# Patient Record
Sex: Female | Born: 1945 | Race: White | Hispanic: No | Marital: Married | Smoking: Never smoker
Health system: Southern US, Community
[De-identification: ages and names within clinical notes are randomized; demographics above are authoritative.]

## PROBLEM LIST (undated history)

## (undated) DIAGNOSIS — N200 Calculus of kidney: Secondary | ICD-10-CM

## (undated) DIAGNOSIS — IMO0001 Reserved for inherently not codable concepts without codable children: Secondary | ICD-10-CM

## (undated) DIAGNOSIS — K219 Gastro-esophageal reflux disease without esophagitis: Secondary | ICD-10-CM

## (undated) DIAGNOSIS — M8580 Other specified disorders of bone density and structure, unspecified site: Secondary | ICD-10-CM

## (undated) DIAGNOSIS — I10 Essential (primary) hypertension: Secondary | ICD-10-CM

## (undated) DIAGNOSIS — M1990 Unspecified osteoarthritis, unspecified site: Secondary | ICD-10-CM

## (undated) HISTORY — PX: LIPOMA EXCISION: SHX5283

## (undated) HISTORY — PX: BACK SURGERY: SHX140

## (undated) HISTORY — PX: TRIGGER FINGER RELEASE: SHX641

## (undated) HISTORY — PX: ROTATOR CUFF REPAIR: SHX139

## (undated) HISTORY — PX: ABDOMINAL HYSTERECTOMY: SHX81

## (undated) HISTORY — PX: OTHER SURGICAL HISTORY: SHX169

## (undated) HISTORY — PX: KNEE ARTHROSCOPY: SUR90

---

## 1986-02-04 HISTORY — PX: TUBAL LIGATION: SHX77

## 2006-02-04 HISTORY — PX: MICRODISCECTOMY LUMBAR: SUR864

## 2010-01-04 ENCOUNTER — Emergency Department (HOSPITAL_BASED_OUTPATIENT_CLINIC_OR_DEPARTMENT_OTHER)
Admission: EM | Admit: 2010-01-04 | Discharge: 2010-01-05 | Payer: Self-pay | Source: Home / Self Care | Admitting: Emergency Medicine

## 2010-04-17 LAB — URINALYSIS, ROUTINE W REFLEX MICROSCOPIC
Glucose, UA: NEGATIVE mg/dL
Hgb urine dipstick: NEGATIVE
Specific Gravity, Urine: 1.007 (ref 1.005–1.030)
Urobilinogen, UA: 0.2 mg/dL (ref 0.0–1.0)
pH: 6.5 (ref 5.0–8.0)

## 2010-04-17 LAB — COMPREHENSIVE METABOLIC PANEL
Albumin: 4.7 g/dL (ref 3.5–5.2)
BUN: 17 mg/dL (ref 6–23)
Calcium: 10.7 mg/dL — ABNORMAL HIGH (ref 8.4–10.5)
Glucose, Bld: 163 mg/dL — ABNORMAL HIGH (ref 70–99)
Sodium: 143 mEq/L (ref 135–145)
Total Protein: 7.4 g/dL (ref 6.0–8.3)

## 2010-04-17 LAB — GLUCOSE, CAPILLARY: Glucose-Capillary: 166 mg/dL — ABNORMAL HIGH (ref 70–99)

## 2010-04-17 LAB — POCT CARDIAC MARKERS
CKMB, poc: 1.7 ng/mL (ref 1.0–8.0)
Myoglobin, poc: 55.1 ng/mL (ref 12–200)
Troponin i, poc: <0.05 ng/mL (ref 0.00–0.09)

## 2010-04-17 LAB — CBC
HCT: 36 % (ref 36.0–46.0)
MCHC: 34.8 g/dL (ref 30.0–36.0)
MCV: 88.5 fL (ref 78.0–100.0)
Platelets: 329 10*3/uL (ref 150–400)
RDW: 12.7 % (ref 11.5–15.5)
WBC: 8.2 10*3/uL (ref 4.0–10.5)

## 2010-04-17 LAB — DIFFERENTIAL
Lymphocytes Relative: 17 % (ref 12–46)
Lymphs Abs: 1.4 10*3/uL (ref 0.7–4.0)
Monocytes Absolute: 0.6 10*3/uL (ref 0.1–1.0)
Monocytes Relative: 7 % (ref 3–12)
Neutro Abs: 5.6 10*3/uL (ref 1.7–7.7)
Neutrophils Relative %: 69 % (ref 43–77)

## 2010-10-25 ENCOUNTER — Encounter: Payer: Self-pay | Admitting: *Deleted

## 2010-10-25 ENCOUNTER — Emergency Department (INDEPENDENT_AMBULATORY_CARE_PROVIDER_SITE_OTHER): Payer: Medicare Other

## 2010-10-25 ENCOUNTER — Emergency Department (HOSPITAL_BASED_OUTPATIENT_CLINIC_OR_DEPARTMENT_OTHER)
Admission: EM | Admit: 2010-10-25 | Discharge: 2010-10-25 | Disposition: A | Discharge status: Home / Self Care | Payer: Medicare Other | Attending: Emergency Medicine | Admitting: Emergency Medicine

## 2010-10-25 DIAGNOSIS — N1330 Unspecified hydronephrosis: Secondary | ICD-10-CM

## 2010-10-25 DIAGNOSIS — N201 Calculus of ureter: Secondary | ICD-10-CM

## 2010-10-25 DIAGNOSIS — Z8739 Personal history of other diseases of the musculoskeletal system and connective tissue: Secondary | ICD-10-CM | POA: Insufficient documentation

## 2010-10-25 DIAGNOSIS — R1032 Left lower quadrant pain: Secondary | ICD-10-CM | POA: Insufficient documentation

## 2010-10-25 DIAGNOSIS — E119 Type 2 diabetes mellitus without complications: Secondary | ICD-10-CM | POA: Insufficient documentation

## 2010-10-25 DIAGNOSIS — Z79899 Other long term (current) drug therapy: Secondary | ICD-10-CM | POA: Insufficient documentation

## 2010-10-25 DIAGNOSIS — K7689 Other specified diseases of liver: Secondary | ICD-10-CM

## 2010-10-25 HISTORY — DX: Essential (primary) hypertension: I10

## 2010-10-25 HISTORY — DX: Unspecified osteoarthritis, unspecified site: M19.90

## 2010-10-25 HISTORY — DX: Gastro-esophageal reflux disease without esophagitis: K21.9

## 2010-10-25 HISTORY — DX: Reserved for inherently not codable concepts without codable children: IMO0001

## 2010-10-25 LAB — COMPREHENSIVE METABOLIC PANEL
Albumin: 4.6 g/dL (ref 3.5–5.2)
BUN: 18 mg/dL (ref 6–23)
Calcium: 10.8 mg/dL — ABNORMAL HIGH (ref 8.4–10.5)
Chloride: 96 mEq/L (ref 96–112)
Creatinine, Ser: 0.7 mg/dL (ref 0.50–1.10)
GFR calc non Af Amer: >60 mL/min (ref >60)
Total Bilirubin: 0.7 mg/dL (ref 0.3–1.2)

## 2010-10-25 LAB — CBC
HCT: 37.7 % (ref 36.0–46.0)
MCH: 30.4 pg (ref 26.0–34.0)
MCV: 86.1 fL (ref 78.0–100.0)
RDW: 12.7 % (ref 11.5–15.5)
WBC: 11.4 10*3/uL — ABNORMAL HIGH (ref 4.0–10.5)

## 2010-10-25 LAB — URINALYSIS, ROUTINE W REFLEX MICROSCOPIC
Glucose, UA: NEGATIVE mg/dL
Leukocytes, UA: NEGATIVE
Protein, ur: NEGATIVE mg/dL
Urobilinogen, UA: 0.2 mg/dL (ref 0.0–1.0)

## 2010-10-25 LAB — URINE MICROSCOPIC-ADD ON

## 2010-10-25 MED ORDER — MORPHINE SULFATE 4 MG/ML IJ SOLN
4.0000 mg | Freq: Once | INTRAMUSCULAR | Status: AC
  Administered 2010-10-25: 4 mg via INTRAVENOUS
  Filled 2010-10-25: qty 1

## 2010-10-25 MED ORDER — ONDANSETRON HCL 4 MG/2ML IJ SOLN
4.0000 mg | Freq: Once | INTRAMUSCULAR | Status: AC
  Administered 2010-10-25: 4 mg via INTRAVENOUS
  Filled 2010-10-25: qty 2

## 2010-10-25 MED ORDER — HYDROMORPHONE HCL 1 MG/ML IJ SOLN: 1.0000 mg | Freq: Once | INTRAMUSCULAR | Status: DC

## 2010-10-25 MED ORDER — ONDANSETRON HCL 4 MG PO TABS: 4.0000 mg | ORAL_TABLET | Freq: Four times a day (QID) | ORAL | Status: AC

## 2010-10-25 MED ORDER — SODIUM CHLORIDE 0.9 % IV BOLUS (SEPSIS)
1000.0000 mL | Freq: Once | INTRAVENOUS | Status: AC
  Administered 2010-10-25: 1000 mL via INTRAVENOUS

## 2010-10-25 MED ORDER — OXYCODONE-ACETAMINOPHEN 5-325 MG PO TABS: 1.0000 | ORAL_TABLET | ORAL | Status: AC | PRN

## 2010-10-25 NOTE — ED Notes (Signed)
I got urine sample from patient, I labeled sample and placed at bedside if ordered. Sara Roach

## 2010-10-25 NOTE — ED Notes (Signed)
Patient states she was awakened with crampy lower left abdominal pain in mid morning x 2 days ago.  Approximately 3 hours later she had nausea and vomiting.  Pain continued all day on Tuesday and decreased on mid day on Wednesday to tolerable level.  Symptoms have been associated with foul smelling burping gas and diarrhea  X 3 on Wednesday morning.  Patient had a sling procedure for her bladder 2110.  Was seen approximately one month had several uti's and was seen by URO MD and had a normal cystoscopy.  Today has decreased energy, increased nausea and crampy abdominal pain.  Urine specimen on arrival to ED is bloody.

## 2010-10-25 NOTE — ED Provider Notes (Signed)
History     CSN: [id number] Arrival date & time: 10/25/2010 11:25 AM   Chief Complaint  Patient presents with  . Abdominal Pain    LLQ pain x 2 days     (Include location/radiation/quality/duration/timing/severity/associated sxs/prior treatment) Patient is a 64 y.o. female presenting with abdominal pain. The history is provided by the patient.  Abdominal Pain The primary symptoms of the illness include abdominal pain. Episode onset: 2 days ago. The onset of the illness was sudden. The problem has not changed since onset. The abdominal pain is located in the LLQ. The abdominal pain does not radiate. The abdominal pain is relieved by nothing. The abdominal pain is exacerbated by vomiting.  Change in bowel habit: Diarrhea 24 hours ago without blood. Associated symptoms comments: Nausea and vomiting.  Reports diarrhea for 24 hours.  Denies fever or chills.  Does report bilateral low back pain. Associated medical issues comments: No history of kidney stones or diverticulitis.  Patient reports no prior pain similar in nature.     Past Medical History  Diagnosis Date  . Hypertension   . Arthritis   . Diabetes mellitus   . Reflux      Past Surgical History  Procedure Date  . Back surgery   . Tubal ligation 1988  . Abdominal hysterectomy   . Lipoma excision     left shoulder  . Microdiscectomy lumbar 2008    l5 s1  . Knee arthroscopy   . Bladder lift     No family history on file.  History  Substance Use Topics  . Smoking status: Never Smoker   . Smokeless tobacco: Not on file  . Alcohol Use: No    OB History    Grav Para Term Preterm Abortions TAB SAB Ect Mult Living                  Review of Systems  Gastrointestinal: Positive for abdominal pain.  All other systems reviewed and are negative.    Allergies  Hydrocodone; Codeine; Sulfa antibiotics; and Ambien  Home Medications   Current Outpatient Rx  Name Route Sig Dispense Refill  . AMLODIPINE BESYLATE  10 MG PO TABS Oral Take 10 mg by mouth daily.      Marland Kitchen DILTIAZEM HCL COATED BEADS 300 MG PO CP24 Oral Take 300 mg by mouth daily.      Marland Kitchen ESTRADIOL 0.05 MG/24HR TD PTTW Transdermal Place 1 patch onto the skin once a week.      . FENOFIBRATE 145 MG PO TABS Oral Take 145 mg by mouth daily.      . OMEGA-3 FATTY ACIDS 1000 MG PO CAPS Oral Take 2 g by mouth daily.      Marland Kitchen ONE-DAILY MULTI VITAMINS PO TABS Oral Take 1 tablet by mouth daily.      . TELMISARTAN 80 MG PO TABS Oral Take 80 mg by mouth daily.        Physical Exam    BP 172/77  Pulse 96  Temp(Src) 98.5 F (36.9 C) (Oral)  Resp 20  Ht 5\' 3"  (1.6 m)  Wt 163 lb (73.936 kg)  BMI 28.87 kg/m2  SpO2 100%  Physical Exam  Nursing note and vitals reviewed. Constitutional: She is oriented to person, place, and time. She appears well-developed and well-nourished.       Uncomfortable appearing  HENT:  Head: Normocephalic and atraumatic.  Eyes: EOM are normal.  Neck: Normal range of motion.  Cardiovascular: Normal rate and regular rhythm.  Pulmonary/Chest: Effort normal and breath sounds normal.  Abdominal: Soft. She exhibits no distension. There is no tenderness.  Musculoskeletal: Normal range of motion.  Neurological: She is alert and oriented to person, place, and time.  Skin: Skin is warm and dry.  Psychiatric: She has a normal mood and affect. Judgment normal.    ED Course  Procedures  Results for orders placed during the hospital encounter of 10/25/10  CBC      Component Value Range   WBC 11.4 (*) 4.0 - 10.5 (K/uL)   RBC 4.38  3.87 - 5.11 (MIL/uL)   Hemoglobin 13.3  12.0 - 15.0 (g/dL)   HCT 04.5  40.9 - 81.1 (%)   MCV 86.1  78.0 - 100.0 (fL)   MCH 30.4  26.0 - 34.0 (pg)   MCHC 35.3  30.0 - 36.0 (g/dL)   RDW 91.4  78.2 - 95.6 (%)   Platelets 314  150 - 400 (K/uL)  COMPREHENSIVE METABOLIC PANEL      Component Value Range   Sodium 136  135 - 145 (mEq/L)   Potassium 3.2 (*) 3.5 - 5.1 (mEq/L)   Chloride 96  96 - 112  (mEq/L)   CO2 24  19 - 32 (mEq/L)   Glucose, Bld 145 (*) 70 - 99 (mg/dL)   BUN 18  6 - 23 (mg/dL)   Creatinine, Ser 2.13  0.50 - 1.10 (mg/dL)   Calcium 08.6 (*) 8.4 - 10.5 (mg/dL)   Total Protein 7.8  6.0 - 8.3 (g/dL)   Albumin 4.6  3.5 - 5.2 (g/dL)   AST 19  0 - 37 (U/L)   ALT 25  0 - 35 (U/L)   Alkaline Phosphatase 57  39 - 117 (U/L)   Total Bilirubin 0.7  0.3 - 1.2 (mg/dL)   GFR calc non Af Amer >60  >60 (mL/min)   GFR calc Af Amer >60  >60 (mL/min)  URINALYSIS, ROUTINE W REFLEX MICROSCOPIC      Component Value Range   Color, Urine YELLOW  YELLOW    Appearance CLOUDY (*) CLEAR    Specific Gravity, Urine 1.011  1.005 - 1.030    pH 8.0  5.0 - 8.0    Glucose, UA NEGATIVE  NEGATIVE (mg/dL)   Hgb urine dipstick LARGE (*) NEGATIVE    Bilirubin Urine NEGATIVE  NEGATIVE    Ketones, ur NEGATIVE  NEGATIVE (mg/dL)   Protein, ur NEGATIVE  NEGATIVE (mg/dL)   Urobilinogen, UA 0.2  0.0 - 1.0 (mg/dL)   Nitrite NEGATIVE  NEGATIVE    Leukocytes, UA NEGATIVE  NEGATIVE   URINE MICROSCOPIC-ADD ON      Component Value Range   Squamous Epithelial / LPF RARE  RARE    WBC, UA 0-2  <3 (WBC/hpf)   RBC / HPF TOO NUMEROUS TO COUNT  <3 (RBC/hpf)   Bacteria, UA RARE  RARE    Ct Abdomen Pelvis Wo Contrast  10/25/2010  *RADIOLOGY REPORT*  Clinical Data: Evaluate for left ureteral calculus  CT ABDOMEN AND PELVIS WITHOUT CONTRAST  Technique:  Multidetector CT imaging of the abdomen and pelvis was performed following the standard protocol without intravenous contrast.  Comparison: None  Findings: Lung bases appear clear.  There is diffuse fatty infiltration of the liver.  No focal liver abnormalities identified.  The spleen is negative.  The adrenal glands are negative.  The pancreas appears normal.  Postoperative changes involving the stomach identified compatible with partial gastrectomy.  The gallbladder appears normal.  There is no  biliary dilatation.  Normal appearance of the right kidney.  No right  nephrolithiasis or hydronephrosis.  No right hydroureter.  There is a left-sided hydronephrosis and hydroureter as well as perinephric fat stranding.  Within the proximal left ureter there is a stone measuring 3.2 mm, image number 46.  No enlarged upper abdominal lymph nodes.  There is no pelvic or inguinal adenopathy identified.  Urinary bladder is normal.  The small bowel loops have a normal course and caliber.  No evidence for obstruction.  The colon is also normal.  The sigmoid diverticula present without acute inflammation.  Review of the visualized osseous structures is significant for lumbar degenerative disc disease.  IMPRESSION:  1.  Proximal left ureteral calculus measures 3.2 mm.  There is associated left-sided hydronephrosis and hydroureter.  2.  Fatty liver.  Original Report Authenticated By: Rosealee Albee, M.D.   I personally reviewed the labs and radiographs from today's visit   No diagnosis found.   MDM Left-sided proximal ureteral stone.  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  Her pain and nausea have resolved under treatment in ER.  Told to return to ER for worsening nausea and vomiting or abdominal pain.  Told to return for fever greater than 101 or any other new or worsening symptoms       Lyanne Co, MD 10/25/10 1347

## 2011-11-02 ENCOUNTER — Encounter (HOSPITAL_BASED_OUTPATIENT_CLINIC_OR_DEPARTMENT_OTHER): Payer: Self-pay | Admitting: *Deleted

## 2011-11-02 ENCOUNTER — Emergency Department (HOSPITAL_BASED_OUTPATIENT_CLINIC_OR_DEPARTMENT_OTHER)
Admission: EM | Admit: 2011-11-02 | Discharge: 2011-11-02 | Disposition: A | Discharge status: Home / Self Care | Payer: Medicare Other | Attending: Emergency Medicine | Admitting: Emergency Medicine

## 2011-11-02 DIAGNOSIS — I10 Essential (primary) hypertension: Secondary | ICD-10-CM | POA: Insufficient documentation

## 2011-11-02 DIAGNOSIS — Z882 Allergy status to sulfonamides status: Secondary | ICD-10-CM | POA: Insufficient documentation

## 2011-11-02 DIAGNOSIS — S61209A Unspecified open wound of unspecified finger without damage to nail, initial encounter: Secondary | ICD-10-CM | POA: Insufficient documentation

## 2011-11-02 DIAGNOSIS — Z7982 Long term (current) use of aspirin: Secondary | ICD-10-CM | POA: Insufficient documentation

## 2011-11-02 DIAGNOSIS — S61219A Laceration without foreign body of unspecified finger without damage to nail, initial encounter: Secondary | ICD-10-CM

## 2011-11-02 DIAGNOSIS — E119 Type 2 diabetes mellitus without complications: Secondary | ICD-10-CM | POA: Insufficient documentation

## 2011-11-02 DIAGNOSIS — W260XXA Contact with knife, initial encounter: Secondary | ICD-10-CM | POA: Insufficient documentation

## 2011-11-02 DIAGNOSIS — Z888 Allergy status to other drugs, medicaments and biological substances status: Secondary | ICD-10-CM | POA: Insufficient documentation

## 2011-11-02 DIAGNOSIS — W261XXA Contact with sword or dagger, initial encounter: Secondary | ICD-10-CM | POA: Insufficient documentation

## 2011-11-02 DIAGNOSIS — K219 Gastro-esophageal reflux disease without esophagitis: Secondary | ICD-10-CM | POA: Insufficient documentation

## 2011-11-02 DIAGNOSIS — Z885 Allergy status to narcotic agent status: Secondary | ICD-10-CM | POA: Insufficient documentation

## 2011-11-02 MED ORDER — LIDOCAINE HCL 2 % IJ SOLN
20.0000 mL | Freq: Once | INTRAMUSCULAR | Status: AC
  Administered 2011-11-02: 400 mg via INTRADERMAL
  Filled 2011-11-02: qty 20

## 2011-11-02 NOTE — ED Provider Notes (Signed)
History     CSN: [id number]  Arrival date & time 11/02/11  1323   First MD Initiated Contact with Patient 11/02/11 1440      Chief Complaint  Patient presents with  . Laceration    (Consider location/radiation/quality/duration/timing/severity/associated sxs/prior treatment) HPI Comments: Patient presents with laceration to her left thumb that she sustained while cutting with a clean kitchen knife. Patient states that the wound immediately bled and she applied pressure and rinsed with water. Bleeding was quickly controlled. She is not on any blood thinner medications. Patient felt lightheaded for a time but now feels better. She denies numbness, tingling, weakness in her finger. No treatments prior to arrival. Onset acute. Course is constant. Nothing makes symptoms better or worse. Last tetanus < 5 yrs per patient.   Patient is a 66 y.o. female presenting with skin laceration. The history is provided by the patient.  Laceration  The incident occurred 1 to 2 hours ago.    Past Medical History  Diagnosis Date  . Hypertension   . Arthritis   . Diabetes mellitus   . Reflux     Past Surgical History  Procedure Date  . Back surgery   . Tubal ligation 1988  . Abdominal hysterectomy   . Lipoma excision     left shoulder  . Microdiscectomy lumbar 2008    l5 s1  . Knee arthroscopy   . Bladder lift     History reviewed. No pertinent family history.  History  Substance Use Topics  . Smoking status: Never Smoker   . Smokeless tobacco: Not on file  . Alcohol Use: No    OB History    Grav Para Term Preterm Abortions TAB SAB Ect Mult Living                  Review of Systems  Constitutional: Negative for activity change.  Musculoskeletal: Negative for back pain, joint swelling and arthralgias.  Skin: Positive for wound.  Neurological: Negative for weakness and numbness.    Allergies  Hydrocodone; Codeine; Sulfa antibiotics; and Zolpidem tartrate  Home Medications    Current Outpatient Rx  Name Route Sig Dispense Refill  . ASPIRIN 81 MG PO CHEW Oral Chew 81 mg by mouth daily.    . ATORVASTATIN CALCIUM 40 MG PO TABS Oral Take 40 mg by mouth daily.    Marland Kitchen METFORMIN HCL 1000 MG PO TABS Oral Take 1,000 mg by mouth 2 (two) times daily with a meal.    . PIOGLITAZONE HCL 30 MG PO TABS Oral Take 30 mg by mouth daily.    Marland Kitchen AMLODIPINE BESYLATE 10 MG PO TABS Oral Take 10 mg by mouth daily.      Marland Kitchen DILTIAZEM HCL ER COATED BEADS 300 MG PO CP24 Oral Take 300 mg by mouth daily.      Marland Kitchen ESTRADIOL 0.05 MG/24HR TD PTTW Transdermal Place 1 patch onto the skin once a week.      . FENOFIBRATE 145 MG PO TABS Oral Take 145 mg by mouth daily.      . OMEGA-3 FATTY ACIDS 1000 MG PO CAPS Oral Take 2 g by mouth daily.      Marland Kitchen ONE-DAILY MULTI VITAMINS PO TABS Oral Take 1 tablet by mouth daily.      . TELMISARTAN 80 MG PO TABS Oral Take 80 mg by mouth daily.        BP 155/68  Pulse 78  Temp 97.4 F (36.3 C) (Oral)  Resp 20  Ht  5\' 3"  (1.6 m)  Wt 169 lb (76.658 kg)  BMI 29.94 kg/m2  SpO2 99%  Physical Exam  Nursing note and vitals reviewed. Constitutional: She appears well-developed and well-nourished.  HENT:  Head: Normocephalic and atraumatic.  Eyes: Pupils are equal, round, and reactive to light.  Neck: Normal range of motion. Neck supple.  Cardiovascular: Exam reveals no decreased pulses.   Musculoskeletal: She exhibits tenderness. She exhibits no edema.       Patient with superficial 2 cm laceration to medial thumb at the thumbnail. Laceration extends into the thumbnail slightly. Hemostatic. Appears clean. Mild gaping. Patient has full range of motion in thumb. Capillary refill is normal  Neurological: She is alert. No sensory deficit.       Motor, sensation, and vascular distal to the injury is fully intact.   Skin: Skin is warm and dry.  Psychiatric: She has a normal mood and affect.   LACERATION REPAIR Performed by: Carolee Rota Authorized by: Carolee Rota Consent: Verbal consent obtained. Risks and benefits: risks, benefits and alternatives were discussed Consent given by: patient Patient identity confirmed: provided demographic data Prepped and Draped in normal sterile fashion Wound explored  Laceration Location: left thumb  Laceration Length: 2cm  No Foreign Bodies seen or palpated  Anesthesia: local infiltration  Local anesthetic: lidocaine 2% without epinephrine  Anesthetic total: 1 ml  Irrigation method: syringe with splash guard Amount of cleaning: standard  Skin closure: 5-0 Ethilon  Number of sutures: 2  Technique: simple interrupted  Patient tolerance: Patient tolerated the procedure well with no immediate complications.   Patient counseled on wound care, need for stitch removal in 5 days.  The patient was urged to return to the Emergency Department urgently with worsening pain, swelling, expanding erythema especially if it streaks away from the affected area, fever, or if they have any other concerns. Patient verbalized understanding.      ED Course  Procedures (including critical care time)  Labs Reviewed - No data to display No results found.   1. Laceration of finger     3:12 PM Patient seen and examined.    Vital signs reviewed and are as follows: Filed Vitals:   11/02/11 1343  BP: 155/68  Pulse: 78  Temp: 97.4 F (36.3 C)  Resp: 20      MDM  Patient with laceration, repaired. Thumb is neurovascularly intact. Wound is clean. No foreign bodies palpated or visualized. Do not suspect tendon injury.        [place], [place] 11/02/11 [phone]

## 2011-11-02 NOTE — ED Notes (Signed)
Pt states she was cutting onions and cut her left thumb. Bandaged PTA and bleeding controlled.

## 2011-11-02 NOTE — ED Notes (Signed)
Discharge instructions reviewed. Pt verbalized understanding.  

## 2011-11-02 NOTE — ED Provider Notes (Signed)
Medical screening examination/treatment/procedure(s) were conducted as a shared visit with non-physician practitioner(s) and myself.  I personally evaluated the patient during the encounter Patient with a laceration to the thumb requiring 2 sutures. No signs of underlying injury patient was instructed on suture removal in 7-10 days and watching for infection  Gwyneth Sprout, MD 11/02/11 1853

## 2011-11-08 ENCOUNTER — Emergency Department (HOSPITAL_BASED_OUTPATIENT_CLINIC_OR_DEPARTMENT_OTHER)
Admission: EM | Admit: 2011-11-08 | Discharge: 2011-11-08 | Disposition: A | Discharge status: Home / Self Care | Payer: Medicare Other | Attending: Emergency Medicine | Admitting: Emergency Medicine

## 2011-11-08 ENCOUNTER — Encounter (HOSPITAL_BASED_OUTPATIENT_CLINIC_OR_DEPARTMENT_OTHER): Payer: Self-pay | Admitting: *Deleted

## 2011-11-08 DIAGNOSIS — Z4802 Encounter for removal of sutures: Secondary | ICD-10-CM

## 2011-11-08 DIAGNOSIS — K219 Gastro-esophageal reflux disease without esophagitis: Secondary | ICD-10-CM | POA: Insufficient documentation

## 2011-11-08 DIAGNOSIS — I10 Essential (primary) hypertension: Secondary | ICD-10-CM | POA: Insufficient documentation

## 2011-11-08 DIAGNOSIS — Z888 Allergy status to other drugs, medicaments and biological substances status: Secondary | ICD-10-CM | POA: Insufficient documentation

## 2011-11-08 DIAGNOSIS — Z882 Allergy status to sulfonamides status: Secondary | ICD-10-CM | POA: Insufficient documentation

## 2011-11-08 DIAGNOSIS — Z885 Allergy status to narcotic agent status: Secondary | ICD-10-CM | POA: Insufficient documentation

## 2011-11-08 DIAGNOSIS — Z7982 Long term (current) use of aspirin: Secondary | ICD-10-CM | POA: Insufficient documentation

## 2011-11-08 DIAGNOSIS — E119 Type 2 diabetes mellitus without complications: Secondary | ICD-10-CM | POA: Insufficient documentation

## 2011-11-08 NOTE — ED Provider Notes (Addendum)
History     CSN: [id number]  Arrival date & time 11/08/11  0745   First MD Initiated Contact with Patient 11/08/11 [phone]      Chief Complaint  Patient presents with  . Wound Check    (Consider location/radiation/quality/duration/timing/severity/associated sxs/prior treatment) Patient is a 66 y.o. female presenting with wound check. The history is provided by the patient.  Wound Check  She was treated in the ED 5 to 10 days ago. Previous treatment in the ED includes laceration repair. There has been no treatment since the wound repair. There has been no drainage from the wound. There is no redness present. There is no swelling present. The pain has no pain.    Past Medical History  Diagnosis Date  . Hypertension   . Arthritis   . Diabetes mellitus   . Reflux     Past Surgical History  Procedure Date  . Back surgery   . Tubal ligation 1988  . Abdominal hysterectomy   . Lipoma excision     left shoulder  . Microdiscectomy lumbar 2008    l5 s1  . Knee arthroscopy   . Bladder lift     No family history on file.  History  Substance Use Topics  . Smoking status: Never Smoker   . Smokeless tobacco: Not on file  . Alcohol Use: No    OB History    Grav Para Term Preterm Abortions TAB SAB Ect Mult Living                  Review of Systems  All other systems reviewed and are negative.    Allergies  Hydrocodone; Codeine; Sulfa antibiotics; and Zolpidem tartrate  Home Medications   Current Outpatient Rx  Name Route Sig Dispense Refill  . AMLODIPINE BESYLATE 10 MG PO TABS Oral Take 10 mg by mouth daily.      . ASPIRIN 81 MG PO CHEW Oral Chew 81 mg by mouth daily.    . ATORVASTATIN CALCIUM 40 MG PO TABS Oral Take 40 mg by mouth daily.    Marland Kitchen DILTIAZEM HCL ER COATED BEADS 300 MG PO CP24 Oral Take 300 mg by mouth daily.      Marland Kitchen ESTRADIOL 0.05 MG/24HR TD PTTW Transdermal Place 1 patch onto the skin once a week.      . FENOFIBRATE 145 MG PO TABS Oral Take 145 mg by  mouth daily.      . OMEGA-3 FATTY ACIDS 1000 MG PO CAPS Oral Take 2 g by mouth daily.      Marland Kitchen METFORMIN HCL 1000 MG PO TABS Oral Take 1,000 mg by mouth 2 (two) times daily with a meal.    . ONE-DAILY MULTI VITAMINS PO TABS Oral Take 1 tablet by mouth daily.      Marland Kitchen PIOGLITAZONE HCL 30 MG PO TABS Oral Take 30 mg by mouth daily.    . TELMISARTAN 80 MG PO TABS Oral Take 80 mg by mouth daily.        BP 143/64  Pulse 69  Temp 97.5 F (36.4 C) (Oral)  Resp 14  SpO2 99%  Physical Exam  Nursing note and vitals reviewed. Constitutional: She is oriented to person, place, and time. She appears well-developed and well-nourished. No distress.  HENT:  Head: Normocephalic and atraumatic.  Musculoskeletal:       Hands: Neurological: She is alert and oriented to person, place, and time.  Skin: Skin is warm and dry.    ED Course  Procedures (including critical care time)  Labs Reviewed - No data to display No results found.   1. Visit for suture removal       MDM   Here for suture removal.  No signs of complications.  2 sutures removed.        Gwyneth Sprout, MD 11/08/11 1610  Gwyneth Sprout, MD 11/08/11 0800  Gwyneth Sprout, MD 11/08/11 1114

## 2011-11-08 NOTE — ED Notes (Signed)
Here for suture removal from left thumb.  Wound C/D/I.

## 2012-03-15 IMAGING — CT CT ABD-PELV W/O CM
2 of 4 series · 16 of 46 positions shown, 18 images · non-contrast
Comparison: None

CLINICAL DATA: Evaluate for left ureteral calculus

CT ABDOMEN AND PELVIS WITHOUT CONTRAST
TECHNIQUE: Multidetector CT imaging of the abdomen and pelvis was
performed following the standard protocol without intravenous
contrast.

[Series 2: renal stone > 200 lbs 5.0 b31f · axial · 0.80mm/px · z∈[-377,+18]mm · 13 of 87 slices shown, 15 images]
[im 4/87  soft-tissue]
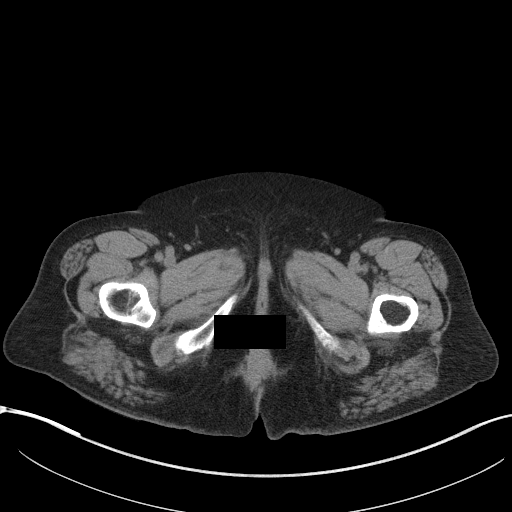
[im 4/87  bone]
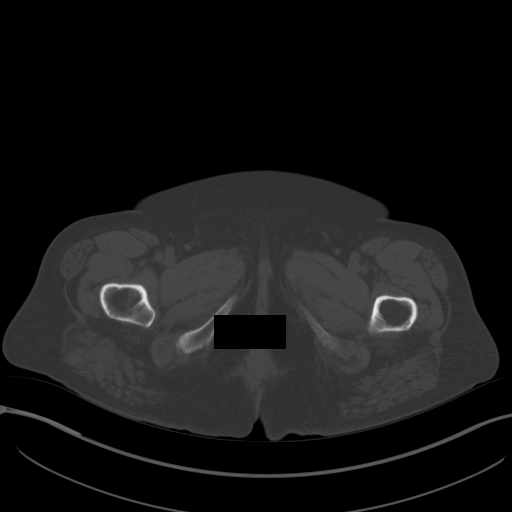
[im 11/87  soft-tissue]
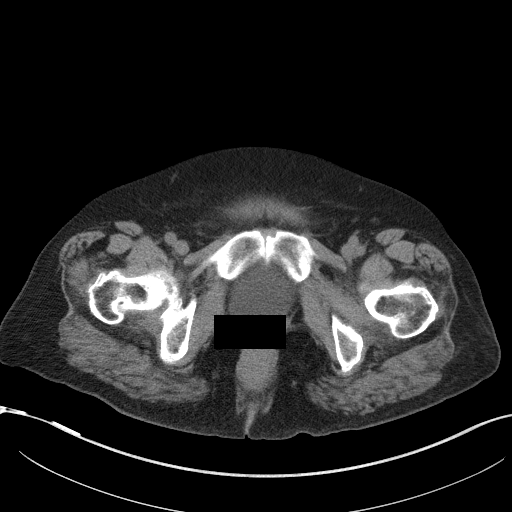
[im 18/87  soft-tissue]
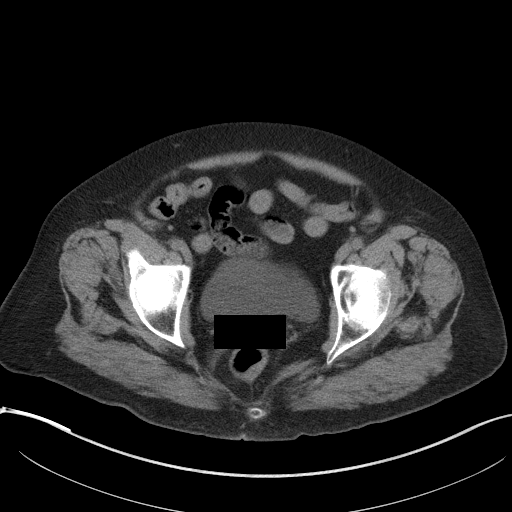
[im 25/87  soft-tissue]
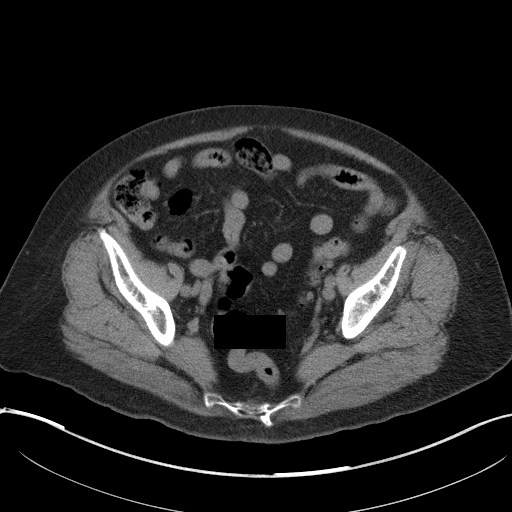
[im 31/87  soft-tissue]
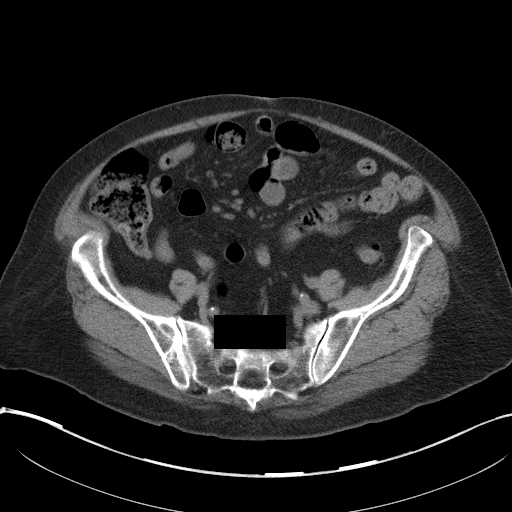
[im 38/87  soft-tissue]
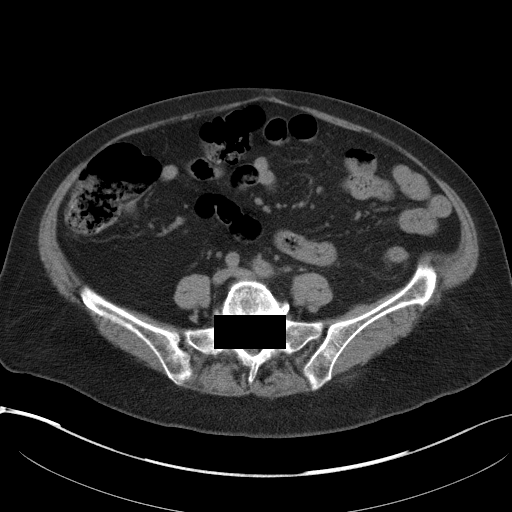
[im 45/87  soft-tissue]
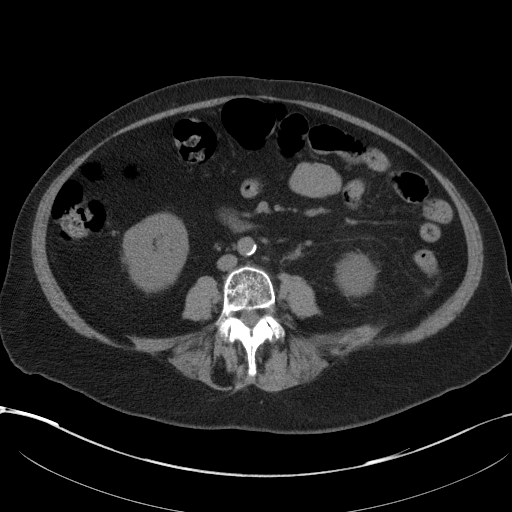
[im 49/87  soft-tissue]
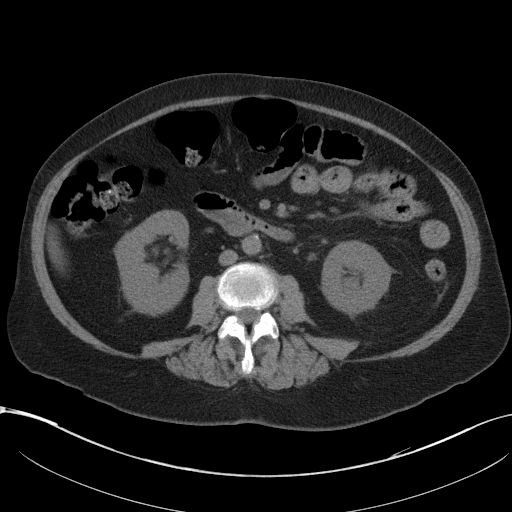
[im 56/87  soft-tissue]
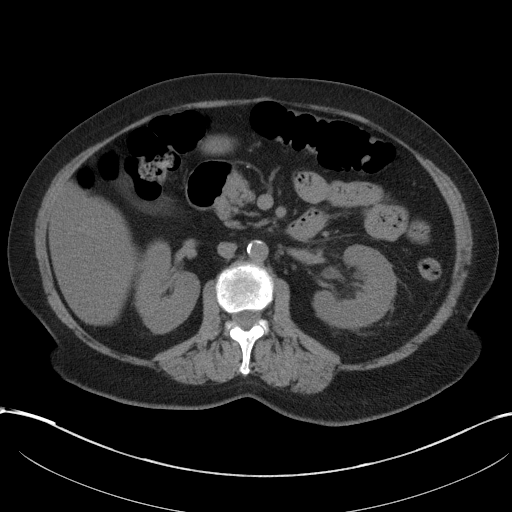
[im 56/87  bone]
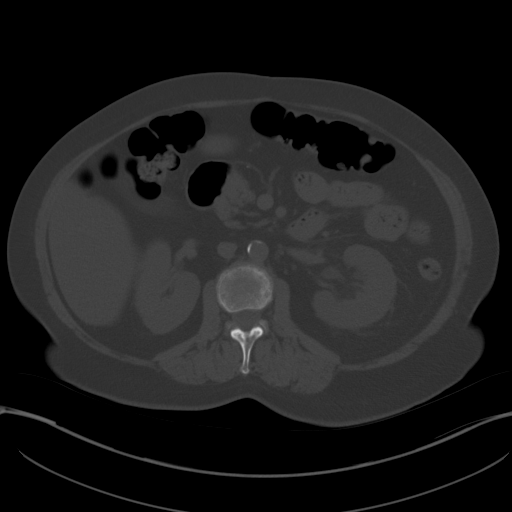
[im 62/87  soft-tissue]
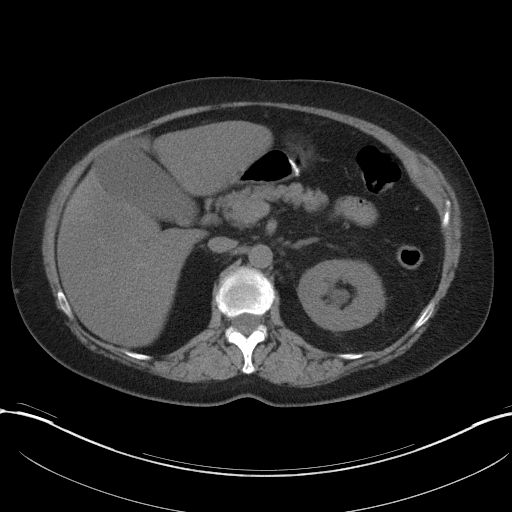
[im 69/87  soft-tissue]
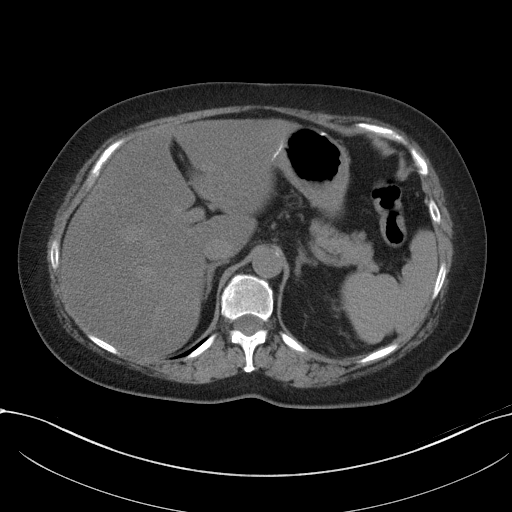
[im 76/87  soft-tissue]
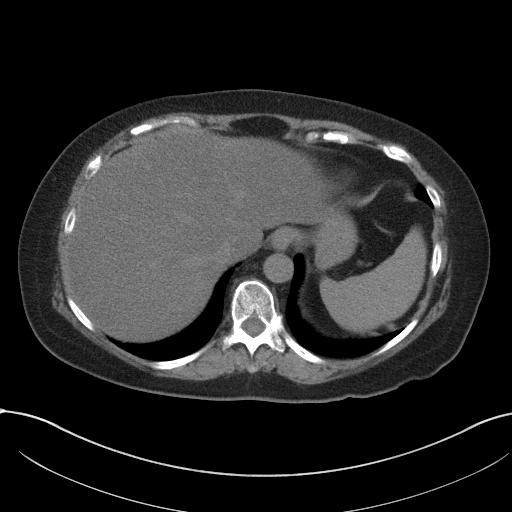
[im 83/87  soft-tissue]
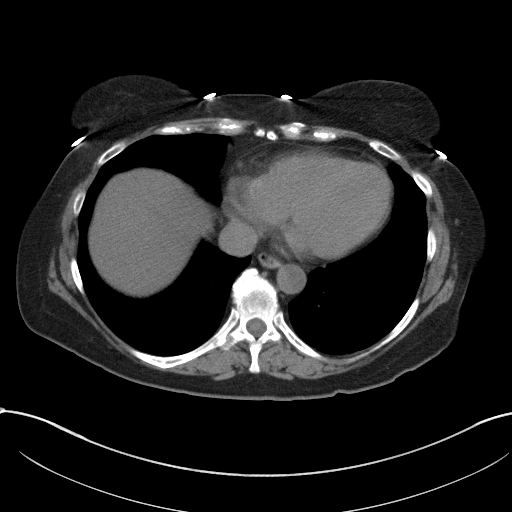

[Series 4: renal stone 3.0 coronal · coronal · 0.77mm/px · 3 of 89 slices shown]
[im 30/89  soft-tissue]
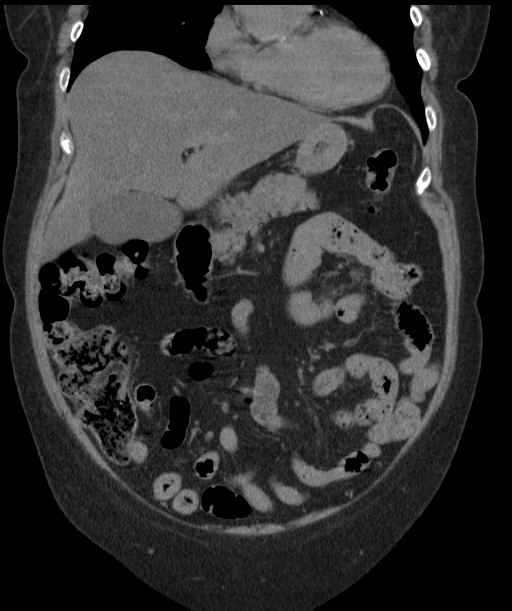
[im 40/89  soft-tissue]
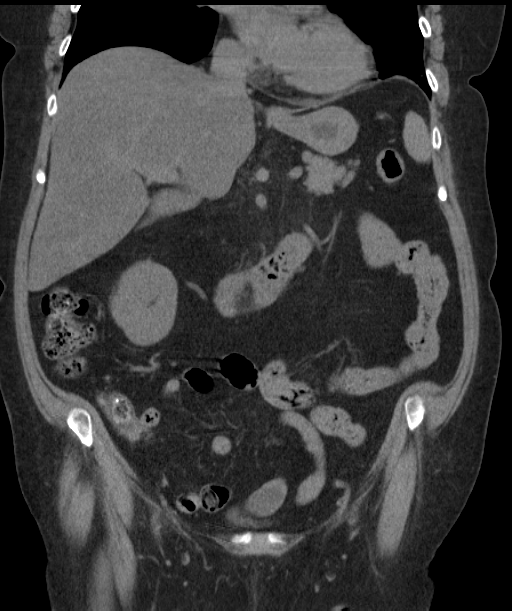
[im 49/89  soft-tissue]
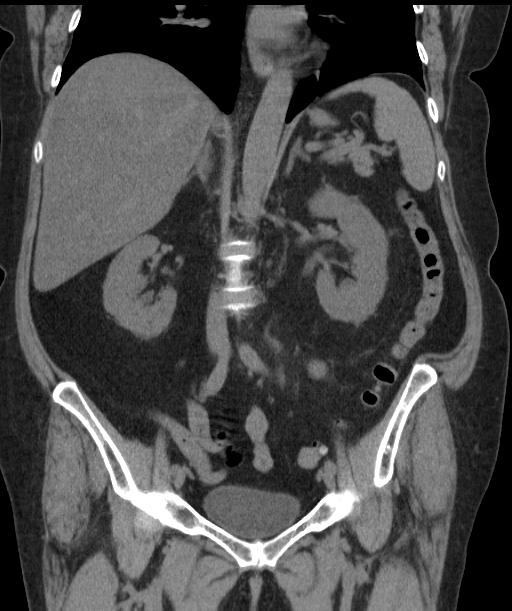

[16 of 46 positions shown; findings below may reference images not displayed]

FINDINGS: Lung bases appear clear.

There is diffuse fatty infiltration of the liver.  No focal liver
abnormalities identified.

The spleen is negative.

The adrenal glands are negative.

The pancreas appears normal.

Postoperative changes involving the stomach identified compatible
with partial gastrectomy.

The gallbladder appears normal.  There is no biliary dilatation.

Normal appearance of the right kidney.  No right nephrolithiasis or
hydronephrosis.  No right hydroureter.

There is a left-sided hydronephrosis and hydroureter as well as
perinephric fat stranding.  Within the proximal left ureter there
is a stone measuring 3.2 mm, image number 46.

No enlarged upper abdominal lymph nodes.

There is no pelvic or inguinal adenopathy identified.

Urinary bladder is normal.

The small bowel loops have a normal course and caliber.  No
evidence for obstruction.

The colon is also normal.

The sigmoid diverticula present without acute inflammation.

Review of the visualized osseous structures is significant for
lumbar degenerative disc disease.
IMPRESSION: 1.  Proximal left ureteral calculus measures 3.2 mm.  There is
associated left-sided hydronephrosis and hydroureter.

2.  Fatty liver.

## 2015-12-31 ENCOUNTER — Emergency Department (HOSPITAL_BASED_OUTPATIENT_CLINIC_OR_DEPARTMENT_OTHER): Payer: Medicare Other

## 2015-12-31 ENCOUNTER — Emergency Department (HOSPITAL_BASED_OUTPATIENT_CLINIC_OR_DEPARTMENT_OTHER)
Admission: EM | Admit: 2015-12-31 | Discharge: 2015-12-31 | Disposition: A | Discharge status: Home / Self Care | Payer: Medicare Other | Attending: Emergency Medicine | Admitting: Emergency Medicine

## 2015-12-31 ENCOUNTER — Encounter (HOSPITAL_BASED_OUTPATIENT_CLINIC_OR_DEPARTMENT_OTHER): Payer: Self-pay | Admitting: *Deleted

## 2015-12-31 DIAGNOSIS — I10 Essential (primary) hypertension: Secondary | ICD-10-CM | POA: Diagnosis not present

## 2015-12-31 DIAGNOSIS — R05 Cough: Secondary | ICD-10-CM | POA: Diagnosis present

## 2015-12-31 DIAGNOSIS — Z79899 Other long term (current) drug therapy: Secondary | ICD-10-CM | POA: Insufficient documentation

## 2015-12-31 DIAGNOSIS — B9789 Other viral agents as the cause of diseases classified elsewhere: Secondary | ICD-10-CM

## 2015-12-31 DIAGNOSIS — Z7982 Long term (current) use of aspirin: Secondary | ICD-10-CM | POA: Insufficient documentation

## 2015-12-31 DIAGNOSIS — E119 Type 2 diabetes mellitus without complications: Secondary | ICD-10-CM | POA: Diagnosis not present

## 2015-12-31 DIAGNOSIS — J069 Acute upper respiratory infection, unspecified: Secondary | ICD-10-CM | POA: Diagnosis not present

## 2015-12-31 HISTORY — DX: Calculus of kidney: N20.0

## 2015-12-31 HISTORY — DX: Other specified disorders of bone density and structure, unspecified site: M85.80

## 2015-12-31 MED ORDER — PREDNISONE 50 MG PO TABS
60.0000 mg | ORAL_TABLET | Freq: Once | ORAL | Status: AC
  Administered 2015-12-31: 60 mg via ORAL
  Filled 2015-12-31: qty 1

## 2015-12-31 NOTE — ED Triage Notes (Signed)
Patient states she has an eight day history of sinus congestion, productive cough with clear, yellow and green secretions, fatigue and sob with exertion.

## 2015-12-31 NOTE — Discharge Instructions (Signed)
Please read and follow all provided instructions.  Your diagnoses today include:  1. Viral URI with cough    You appear to have an upper respiratory infection (URI). An upper respiratory tract infection, or cold, is a viral infection of the air passages leading to the lungs. It should improve gradually after 5-7 days. You may have a lingering cough that lasts for 2- 4 weeks after the infection.  Tests performed today include: Vital signs. See below for your results today.   Medications prescribed:   Take any prescribed medications only as directed. Treatment for your infection is aimed at treating the symptoms. There are no medications, such as antibiotics, that will cure your infection.   Home care instructions:  Follow any educational materials contained in this packet.   Your illness is contagious and can be spread to others, especially during the first 3 or 4 days. It cannot be cured by antibiotics or other medicines. Take basic precautions such as washing your hands often, covering your mouth when you cough or sneeze, and avoiding public places where you could spread your illness to others.   Please continue drinking plenty of fluids.  Use over-the-counter medicines as needed as directed on packaging for symptom relief.  You may also use ibuprofen or tylenol as directed on packaging for pain or fever.  Do not take multiple medicines containing Tylenol or acetaminophen to avoid taking too much of this medication.  Follow-up instructions: Please follow-up with your primary care provider in the next 3 days for further evaluation of your symptoms if you are not feeling better.   Return instructions:  Please return to the Emergency Department if you experience worsening symptoms.  RETURN IMMEDIATELY IF you develop shortness of breath, confusion or altered mental status, a new rash, become dizzy, faint, or poorly responsive, or are unable to be cared for at home. Please return if you have  persistent vomiting and cannot keep down fluids or develop a fever that is not controlled by tylenol or motrin.   Please return if you have any other emergent concerns.  Additional Information:  Your vital signs today were: BP 168/69 (BP Location: Right Arm)    Pulse 87    Temp 98.5 F (36.9 C) (Oral)    Resp 18    Ht 5\' 2"  (1.575 m)    Wt 74.8 kg    SpO2 98%    BMI 30.18 kg/m  If your blood pressure (BP) was elevated above 135/85 this visit, please have this repeated by your doctor within one month. --------------

## 2015-12-31 NOTE — ED Provider Notes (Signed)
[facility] Provider Note   CSN: [id number] Arrival date & time: 12/31/15  1106  History   Chief Complaint Chief Complaint  Patient presents with  . URI    HPI Sara Roach is a 70 y.o. female.  HPI  70 y.o. female with a hx of DM, HTN, presents to the Emergency Department today complaining of eight day history of sinus congestion, cough (productive), fatigue and shortness of breath. Notes minimal relief with mucinex and OTC tylenol/motrin. Pt concern with pneumonia. No hx of pneumonia. No fevers at home. No N/V/D. No CP/ABD pain. PO intake unchanged. ADLs WNL. No pain currently. No other symptoms noted.    Past Medical History:  Diagnosis Date  . Arthritis   . Diabetes mellitus   . Hypertension   . Kidney stone   . Osteopenia   . Reflux     There are no active problems to display for this patient.   Past Surgical History:  Procedure Laterality Date  . ABDOMINAL HYSTERECTOMY    . BACK SURGERY    . bladder lift    . KNEE ARTHROSCOPY    . leiomyosarcoma    . LIPOMA EXCISION     left shoulder  . MICRODISCECTOMY LUMBAR  2008   l5 s1  . ROTATOR CUFF REPAIR Right   . TRIGGER FINGER RELEASE    . TUBAL LIGATION  1988    OB History    No data available       Home Medications    Prior to Admission medications   Medication Sig Start Date End Date Taking? Authorizing Provider  amLODipine (NORVASC) 10 MG tablet Take 10 mg by mouth daily.     Yes Historical Provider, MD  aspirin 81 MG chewable tablet Chew 81 mg by mouth daily.   Yes Historical Provider, MD  atorvastatin (LIPITOR) 40 MG tablet Take 40 mg by mouth daily.   Yes Historical Provider, MD  diltiazem (CARDIZEM CD) 300 MG 24 hr capsule Take 300 mg by mouth daily.     Yes Historical Provider, MD  empagliflozin (JARDIANCE) 25 MG TABS tablet Take 25 mg by mouth daily.   Yes Historical Provider, MD  fenofibrate (TRICOR) 145 MG tablet Take 145 mg by mouth daily.     Yes Historical Provider, MD  fish  oil-omega-3 fatty acids 1000 MG capsule Take 2 g by mouth daily.     Yes Historical Provider, MD  gabapentin (NEURONTIN) 300 MG capsule Take 300 mg by mouth 3 (three) times daily.   Yes Historical Provider, MD  montelukast (SINGULAIR) 10 MG tablet Take 10 mg by mouth at bedtime.   Yes Historical Provider, MD  nystatin cream (MYCOSTATIN) Apply 1 application topically 2 (two) times daily.   Yes Historical Provider, MD  sitaGLIPtin-metformin (JANUMET) 50-1000 MG tablet Take 1 tablet by mouth 2 (two) times daily with a meal.   Yes Historical Provider, MD  telmisartan (MICARDIS) 80 MG tablet Take 80 mg by mouth daily.     Yes Historical Provider, MD  estradiol (VIVELLE-DOT) 0.05 MG/24HR Place 1 patch onto the skin once a week.      Historical Provider, MD  metFORMIN (GLUCOPHAGE) 1000 MG tablet Take 1,000 mg by mouth 2 (two) times daily with a meal.    Historical Provider, MD  Multiple Vitamin (MULTIVITAMIN) tablet Take 1 tablet by mouth daily.      Historical Provider, MD  pioglitazone (ACTOS) 30 MG tablet Take 30 mg by mouth daily.    Historical  Provider, MD    Family History No family history on file.  Social History Social History  Substance Use Topics  . Smoking status: Never Smoker  . Smokeless tobacco: Not on file  . Alcohol use No     Allergies   Hydrocodone; Codeine; Sulfa antibiotics; and Zolpidem tartrate   Review of Systems Review of Systems  Constitutional: Negative for fever.  HENT: Positive for congestion and sore throat. Negative for rhinorrhea.   Respiratory: Positive for shortness of breath.   Cardiovascular: Negative for chest pain.  Gastrointestinal: Negative for diarrhea, nausea and vomiting.   Physical Exam Updated Vital Signs BP 168/69 (BP Location: Right Arm)   Pulse 87   Temp 98.5 F (36.9 C) (Oral)   Resp 18   Ht 5\' 2"  (1.575 m)   Wt 74.8 kg   SpO2 98%   BMI 30.18 kg/m   Physical Exam  Constitutional: She is oriented to person, place, and time.  Vital signs are normal. She appears well-developed and well-nourished.  HENT:  Head: Normocephalic.  Right Ear: Hearing normal.  Left Ear: Hearing normal.  Eyes: Conjunctivae and EOM are normal. Pupils are equal, round, and reactive to light.  Neck: Normal range of motion. Neck supple.  Cardiovascular: Normal rate, regular rhythm, normal heart sounds and intact distal pulses.   Pulmonary/Chest: Effort normal and breath sounds normal.  Abdominal: Soft.  Musculoskeletal: Normal range of motion.  Neurological: She is alert and oriented to person, place, and time.  Skin: Skin is warm and dry.  Psychiatric: She has a normal mood and affect. Her speech is normal and behavior is normal. Thought content normal.  Nursing note and vitals reviewed.  ED Treatments / Results  Labs (all labs ordered are listed, but only abnormal results are displayed) Labs Reviewed - No data to display  EKG  EKG Interpretation None      Radiology Dg Chest 2 View  Result Date: 12/31/2015 CLINICAL DATA:  Cough and congestion for several days EXAM: CHEST  2 VIEW COMPARISON:  01/04/2010 FINDINGS: The heart size and mediastinal contours are within normal limits. Both lungs are clear. The visualized skeletal structures are unremarkable. IMPRESSION: No active cardiopulmonary disease. Electronically Signed   By: Alcide Clever M.D.   On: 12/31/2015 12:24    Procedures Procedures (including critical care time)  Medications Ordered in ED Medications - No data to display   Initial Impression / Assessment and Plan / ED Course  I have reviewed the triage vital signs and the nursing notes.  Pertinent labs & imaging results that were available during my care of the patient were reviewed by me and considered in my medical decision making (see chart for details).  Clinical Course    Final Clinical Impressions(s) / ED Diagnoses   {I have reviewed and evaluated the relevant imaging studies.  {I have reviewed the  relevant previous healthcare records.  {I obtained HPI from historian.   ED Course:  Assessment: Pt is a 70yF presents with URi symptoms x 8 days. No fevers. No N/V/D. No CP/ABD pain. Notes congestion and sore throat. On exam, pt in NAD. VSS. Afebrile. Lungs CTA, Heart RRR. Abdomen nontender/soft. Pt CXR negative for acute infiltrate. Patients symptoms are consistent with URI, likely viral etiology. Discussed that antibiotics are not indicated for viral infections. Pt will be discharged with symptomatic treatment.  Verbalizes understanding and is agreeable with plan. Pt is hemodynamically stable & in NAD prior to dc.  Disposition/Plan:  DC Home Additional Verbal  discharge instructions given and discussed with patient.  Pt Instructed to f/u with PCP in the next week for evaluation and treatment of symptoms. Return precautions given Pt acknowledges and agrees with plan  Supervising Physician Jerelyn ScottMartha Linker, MD  Final diagnoses:  Viral URI with cough    New Prescriptions New Prescriptions   No medications on file     Audry Piliyler Kerianne Gurr, PA-C 12/31/15 1619    Jerelyn ScottMartha Linker, MD 12/31/15 1620

## 2015-12-31 NOTE — ED Notes (Signed)
Patient denies pain and is resting comfortably.  

## 2016-02-21 ENCOUNTER — Emergency Department (HOSPITAL_BASED_OUTPATIENT_CLINIC_OR_DEPARTMENT_OTHER)
Admission: EM | Admit: 2016-02-21 | Discharge: 2016-02-21 | Disposition: A | Discharge status: Home / Self Care | Payer: Medicare Other | Attending: Emergency Medicine | Admitting: Emergency Medicine

## 2016-02-21 ENCOUNTER — Encounter (HOSPITAL_BASED_OUTPATIENT_CLINIC_OR_DEPARTMENT_OTHER): Payer: Self-pay

## 2016-02-21 DIAGNOSIS — Z79899 Other long term (current) drug therapy: Secondary | ICD-10-CM | POA: Insufficient documentation

## 2016-02-21 DIAGNOSIS — Z7984 Long term (current) use of oral hypoglycemic drugs: Secondary | ICD-10-CM | POA: Insufficient documentation

## 2016-02-21 DIAGNOSIS — N3000 Acute cystitis without hematuria: Secondary | ICD-10-CM | POA: Diagnosis not present

## 2016-02-21 DIAGNOSIS — Z7982 Long term (current) use of aspirin: Secondary | ICD-10-CM | POA: Diagnosis not present

## 2016-02-21 DIAGNOSIS — I10 Essential (primary) hypertension: Secondary | ICD-10-CM | POA: Diagnosis not present

## 2016-02-21 DIAGNOSIS — R300 Dysuria: Secondary | ICD-10-CM | POA: Diagnosis present

## 2016-02-21 DIAGNOSIS — E119 Type 2 diabetes mellitus without complications: Secondary | ICD-10-CM | POA: Insufficient documentation

## 2016-02-21 LAB — URINALYSIS, ROUTINE W REFLEX MICROSCOPIC
Bilirubin Urine: NEGATIVE
Glucose, UA: >=500 mg/dL — AB
Ketones, ur: NEGATIVE mg/dL
Nitrite: POSITIVE — AB
PROTEIN: NEGATIVE mg/dL
Specific Gravity, Urine: 1.025 (ref 1.005–1.030)
pH: 7 (ref 5.0–8.0)

## 2016-02-21 LAB — URINALYSIS, MICROSCOPIC (REFLEX)

## 2016-02-21 MED ORDER — CEPHALEXIN 250 MG PO CAPS
500.0000 mg | ORAL_CAPSULE | Freq: Once | ORAL | Status: AC
  Administered 2016-02-21: 500 mg via ORAL
  Filled 2016-02-21: qty 2

## 2016-02-21 MED ORDER — CEPHALEXIN 500 MG PO CAPS: 500.0000 mg | ORAL_CAPSULE | Freq: Three times a day (TID) | ORAL | 0 refills | Status: AC

## 2016-02-21 NOTE — ED Provider Notes (Signed)
[facility]   CSN: [id number] Arrival date & time: 02/21/16  [id number]  By signing my name below, I, Sara Roach, attest that this documentation has been prepared under the direction and in the presence of [name], PA-C. Electronically Signed: Javier Dockerobert Ryan Roach, ER Scribe. 09/16/2015. 7:15 PM.  History   Chief Complaint Chief Complaint  Patient presents with  . Dysuria   The history is provided by the patient. No language interpreter was used.    HPI Comments: Sara Roach is a 71 y.o. female who presents to the Emergency Department complaining of 4 days of urinary urgency, strong urine odor, nausea, dysuria and urinary frequency. She denies flank pain, vomiting, hematuria, abnormal BM, abdominal pain, or fever/chills. Last BM this morning and normal. Eating drinking well. She has taken AZO with no relief.  Hx of UTIs, feels the same. She has not taken any pain medications. States went to PCP yesterday, had UA done, was not given any medications, has not heard from them and the office is closed today. States today symptoms became more unbearable so she came here.    Past Medical History:  Diagnosis Date  . Arthritis   . Diabetes mellitus   . Hypertension   . Kidney stone   . Osteopenia   . Reflux     There are no active problems to display for this patient.   Past Surgical History:  Procedure Laterality Date  . ABDOMINAL HYSTERECTOMY    . BACK SURGERY    . bladder lift    . KNEE ARTHROSCOPY    . leiomyosarcoma    . LIPOMA EXCISION     left shoulder  . MICRODISCECTOMY LUMBAR  2008   l5 s1  . ROTATOR CUFF REPAIR Right   . TRIGGER FINGER RELEASE    . TUBAL LIGATION  1988    OB History    No data available       Home Medications    Prior to Admission medications   Medication Sig Start Date End Date Taking? Authorizing Provider  amLODipine (NORVASC) 10 MG tablet Take 10 mg by mouth daily.      Historical Provider, MD    aspirin 81 MG chewable tablet Chew 81 mg by mouth daily.    Historical Provider, MD  atorvastatin (LIPITOR) 40 MG tablet Take 40 mg by mouth daily.    Historical Provider, MD  diltiazem (CARDIZEM CD) 300 MG 24 hr capsule Take 300 mg by mouth daily.      Historical Provider, MD  empagliflozin (JARDIANCE) 25 MG TABS tablet Take 25 mg by mouth daily.    Historical Provider, MD  estradiol (VIVELLE-DOT) 0.05 MG/24HR Place 1 patch onto the skin once a week.      Historical Provider, MD  fenofibrate (TRICOR) 145 MG tablet Take 145 mg by mouth daily.      Historical Provider, MD  fish oil-omega-3 fatty acids 1000 MG capsule Take 2 g by mouth daily.      Historical Provider, MD  gabapentin (NEURONTIN) 300 MG capsule Take 300 mg by mouth 3 (three) times daily.    Historical Provider, MD  metFORMIN (GLUCOPHAGE) 1000 MG tablet Take 1,000 mg by mouth 2 (two) times daily with a meal.    Historical Provider, MD  montelukast (SINGULAIR) 10 MG tablet Take 10 mg by mouth at bedtime.    Historical Provider, MD  Multiple Vitamin (MULTIVITAMIN) tablet Take 1 tablet by mouth daily.  Historical Provider, MD  nystatin cream (MYCOSTATIN) Apply 1 application topically 2 (two) times daily.    Historical Provider, MD  pioglitazone (ACTOS) 30 MG tablet Take 30 mg by mouth daily.    Historical Provider, MD  sitaGLIPtin-metformin (JANUMET) 50-1000 MG tablet Take 1 tablet by mouth 2 (two) times daily with a meal.    Historical Provider, MD  telmisartan (MICARDIS) 80 MG tablet Take 80 mg by mouth daily.      Historical Provider, MD    Family History No family history on file.  Social History Social History  Substance Use Topics  . Smoking status: Never Smoker  . Smokeless tobacco: Never Used  . Alcohol use No     Allergies   Hydrocodone; Codeine; Sulfa antibiotics; and Zolpidem tartrate   Review of Systems Review of Systems  Constitutional: Negative for chills and fever.  Gastrointestinal: Positive for  nausea. Negative for abdominal pain, diarrhea and vomiting.  Genitourinary: Positive for dysuria, frequency and urgency. Negative for flank pain, hematuria and pelvic pain.  Musculoskeletal: Negative for back pain.  All other systems reviewed and are negative.  A complete 10 system review of systems was obtained and all systems are negative except as noted in the HPI and PMH.   Physical Exam Updated Vital Signs BP 153/67 (BP Location: Left Arm)   Pulse 87   Temp 98.1 F (36.7 C) (Oral)   Resp 18   Ht 5\' 2"  (1.575 m)   Wt 165 lb (74.8 kg)   SpO2 100%   BMI 30.18 kg/m   Physical Exam  Constitutional: She is oriented to person, place, and time. She appears well-developed and well-nourished. No distress.  HENT:  Head: Normocephalic and atraumatic.  Eyes: Pupils are equal, round, and reactive to light.  Neck: Neck supple.  Cardiovascular: Normal rate, regular rhythm and normal heart sounds.   Pulmonary/Chest: Effort normal and breath sounds normal. No respiratory distress. She has no wheezes. She has no rales.  Abdominal: Soft. Bowel sounds are normal. She exhibits no distension. There is no guarding.  Mild suprapubic tenderness. No CVA tenderness bilaterally  Musculoskeletal: Normal range of motion.  Neurological: She is alert and oriented to person, place, and time. Coordination normal.  Skin: Skin is warm and dry. She is not diaphoretic.  Psychiatric: She has a normal mood and affect. Her behavior is normal.  Nursing note and vitals reviewed.   ED Treatments / Results  DIAGNOSTIC STUDIES: Oxygen Saturation is 100% on RA, normal by my interpretation.    COORDINATION OF CARE: 7:15 PM Discussed treatment plan with pt at bedside and pt agreed to plan.  Labs (all labs ordered are listed, but only abnormal results are displayed) Labs Reviewed  URINALYSIS, ROUTINE W REFLEX MICROSCOPIC - Abnormal; Notable for the following:       Result Value   Color, Urine ORANGE (*)     APPearance CLOUDY (*)    Glucose, UA >=500 (*)    Hgb urine dipstick SMALL (*)    Nitrite POSITIVE (*)    Leukocytes, UA MODERATE (*)    All other components within normal limits  URINALYSIS, MICROSCOPIC (REFLEX) - Abnormal; Notable for the following:    Bacteria, UA FEW (*)    Squamous Epithelial / LPF 0-5 (*)    All other components within normal limits  URINE CULTURE    EKG  EKG Interpretation None       Radiology No results found.  Procedures Procedures (including critical care time)  Medications  Ordered in ED Medications - No data to display   Initial Impression / Assessment and Plan / ED Course  I have reviewed the triage vital signs and the nursing notes.  Pertinent labs & imaging results that were available during my care of the patient were reviewed by me and considered in my medical decision making (see chart for details).  Clinical Course   Patient with urinary frequency, urgency, orders urine, onset 4 days ago. History of UTIs, feels the same. No vomiting, no flank pain, no fever. Afebrile here, vital signs are normal here other than mild hypertension, blood pressures 150/67. Abdomen is soft, nontender. No CVA tenderness on exam. No concern for pyelonephritis or systemic illness/sepsis. Urinalysis suggestive of infection with too numerous to count white blood cells, few bacteria. Will start on Keflex. Will have patient follow with primary care doctor for recheck, return precautions discussed.  Vitals:   02/21/16 1901  BP: 153/67  Pulse: 87  Resp: 18  Temp: 98.1 F (36.7 C)  TempSrc: Oral  SpO2: 100%  Weight: 74.8 kg  Height: 5\' 2"  (1.575 m)    Final Clinical Impressions(s) / ED Diagnoses   Final diagnoses:  Acute cystitis without hematuria    New Prescriptions Discharge Medication List as of 02/21/2016  7:43 PM    START taking these medications   Details  cephALEXin (KEFLEX) 500 MG capsule Take 1 capsule (500 mg total) by mouth 3 (three) times  daily., Starting Wed 02/21/2016, Print         I personally performed the services described in this documentation, which was scribed in my presence. The recorded information has been reviewed and is accurate.        Jaynie Crumble, PA-C 02/21/16 1951    Maia Plan, MD 02/22/16 [id number]

## 2016-02-21 NOTE — Discharge Instructions (Signed)
Continue azo as needed. Take keflex as prescribed until all gone. Follow up with your doctor or return here if not improving in 2-3 days. Return sooner if rapidly worsening, if develop high fever, back pain, vomiting, any new concerning symptom.

## 2016-02-21 NOTE — ED Triage Notes (Signed)
Pt c/o dysuria x 5 days-NAD-steady gait

## 2016-02-24 LAB — URINE CULTURE

## 2017-05-21 IMAGING — CR DG CHEST 2V
2 series · 2 of 2 positions shown · non-contrast
Comparison: 01/04/2010

CLINICAL DATA: Cough and congestion for several days

EXAM:
CHEST  2 VIEW

[w chest pa]
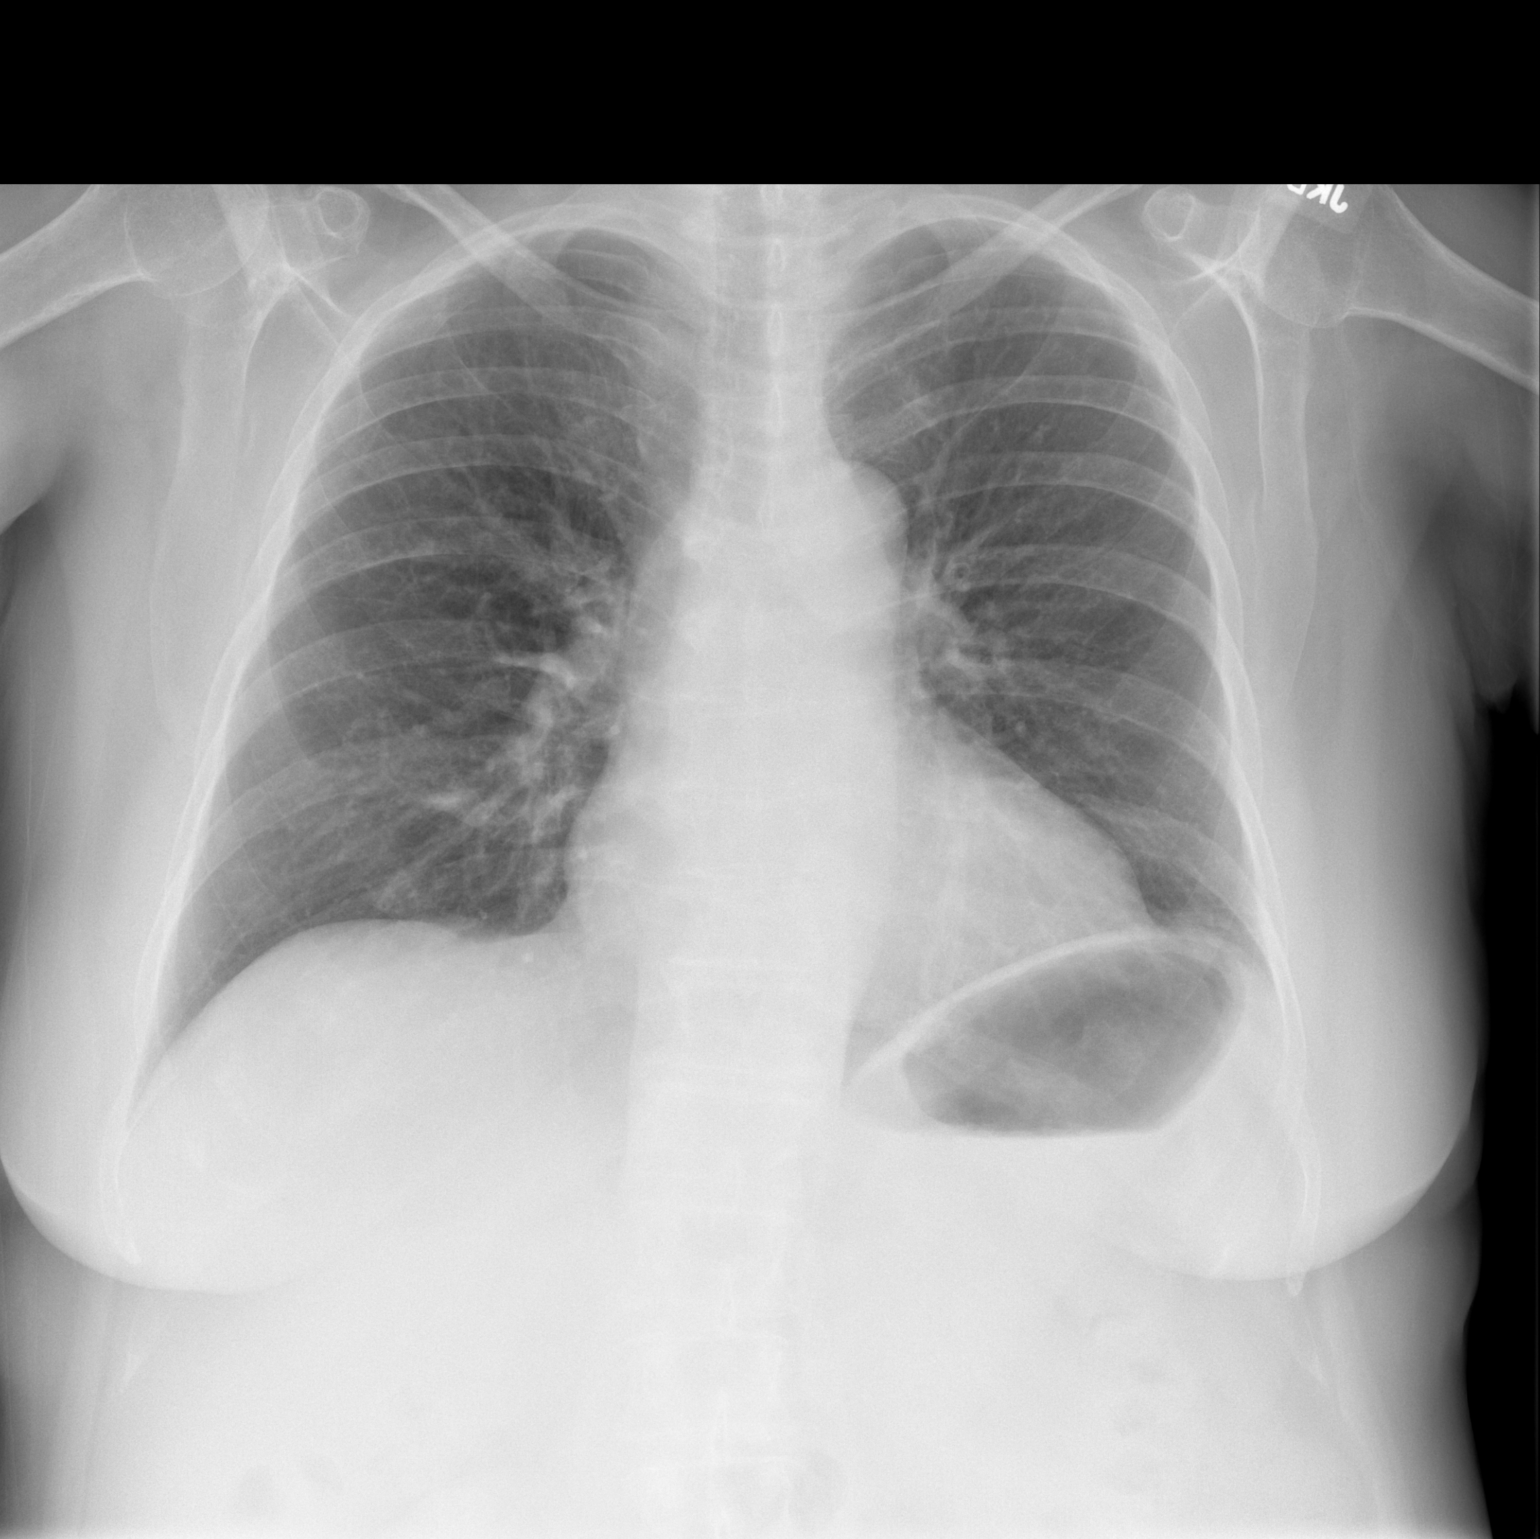

[w chest lat]
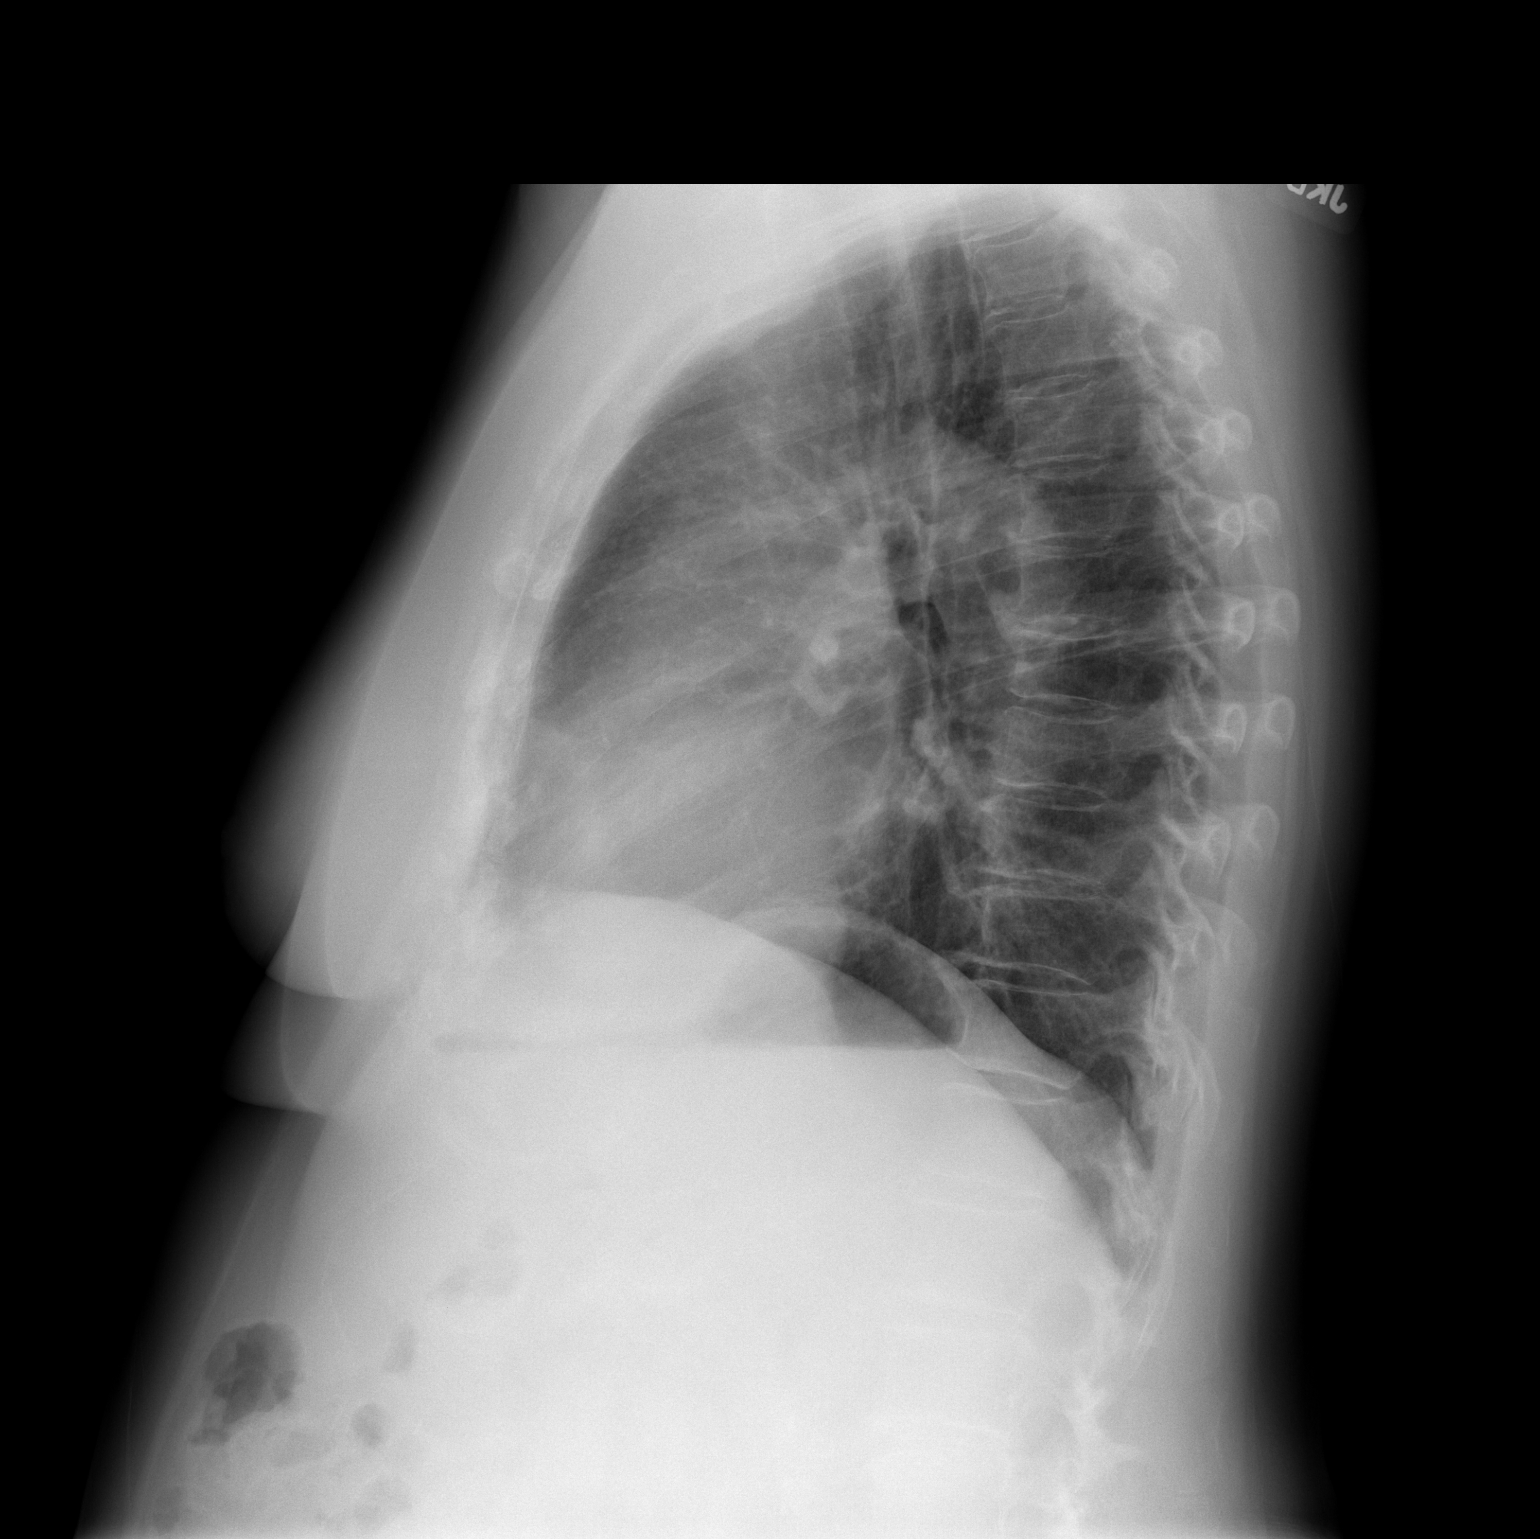

[2 of 2 positions shown; findings below may reference images not displayed]

FINDINGS: The heart size and mediastinal contours are within normal limits.
Both lungs are clear. The visualized skeletal structures are
unremarkable.
IMPRESSION: No active cardiopulmonary disease.

## 2018-02-03 ENCOUNTER — Emergency Department (HOSPITAL_BASED_OUTPATIENT_CLINIC_OR_DEPARTMENT_OTHER)
Admission: EM | Admit: 2018-02-03 | Discharge: 2018-02-03 | Disposition: A | Discharge status: Home / Self Care | Payer: Medicare Other | Attending: Emergency Medicine | Admitting: Emergency Medicine

## 2018-02-03 ENCOUNTER — Other Ambulatory Visit: Payer: Self-pay

## 2018-02-03 ENCOUNTER — Encounter (HOSPITAL_BASED_OUTPATIENT_CLINIC_OR_DEPARTMENT_OTHER): Payer: Self-pay | Admitting: Emergency Medicine

## 2018-02-03 ENCOUNTER — Emergency Department (HOSPITAL_BASED_OUTPATIENT_CLINIC_OR_DEPARTMENT_OTHER): Payer: Medicare Other

## 2018-02-03 DIAGNOSIS — B9689 Other specified bacterial agents as the cause of diseases classified elsewhere: Secondary | ICD-10-CM | POA: Diagnosis not present

## 2018-02-03 DIAGNOSIS — Z79899 Other long term (current) drug therapy: Secondary | ICD-10-CM | POA: Insufficient documentation

## 2018-02-03 DIAGNOSIS — I10 Essential (primary) hypertension: Secondary | ICD-10-CM | POA: Insufficient documentation

## 2018-02-03 DIAGNOSIS — J019 Acute sinusitis, unspecified: Secondary | ICD-10-CM | POA: Diagnosis not present

## 2018-02-03 DIAGNOSIS — Z7982 Long term (current) use of aspirin: Secondary | ICD-10-CM | POA: Insufficient documentation

## 2018-02-03 DIAGNOSIS — R05 Cough: Secondary | ICD-10-CM | POA: Diagnosis present

## 2018-02-03 DIAGNOSIS — H6691 Otitis media, unspecified, right ear: Secondary | ICD-10-CM

## 2018-02-03 DIAGNOSIS — E119 Type 2 diabetes mellitus without complications: Secondary | ICD-10-CM | POA: Insufficient documentation

## 2018-02-03 MED ORDER — AMOXICILLIN-POT CLAVULANATE 875-125 MG PO TABS: 1.0000 | ORAL_TABLET | Freq: Two times a day (BID) | ORAL | 0 refills | Status: AC

## 2018-02-03 NOTE — Discharge Instructions (Addendum)
Saline sinus rinse twice daily. Flonase twice daily for 5 days, then continue with daily use. Zyrtec daily. Take Augmentin as prescribed and complete the full course. You may continue with Mucinex. Avoid dairy products (makes mucous thick). Drink plenty of water to keep mucous thin. Use a humidifier at night.

## 2018-02-03 NOTE — ED Provider Notes (Signed)
[facility] EMERGENCY DEPARTMENT Provider Note   CSN: [id number] Arrival date & time: 02/03/18  1020     History   Chief Complaint Chief Complaint  Patient presents with  . Cough    HPI Sara Roach is a 72 y.o. female.  72 year old female presents with complaint of sinus congestion x1 month with productive cough.  Patient has been taking Mucinex, reports worsening pain in her sinuses, went to her PCP who prescribed Zithromax 5 days ago.  Patient completed a 5-day course of Zithromax, reports symptoms have only gotten worse and now has pain and pressure in her right ear.  Patient denies fevers, chills, shortness of breath.  Patient is taking Flonase daily as well as Singulair in addition to her regular medicines.  No other complaints or concerns.     Past Medical History:  Diagnosis Date  . Arthritis   . Diabetes mellitus   . Hypertension   . Kidney stone   . Osteopenia   . Reflux     There are no active problems to display for this patient.   Past Surgical History:  Procedure Laterality Date  . ABDOMINAL HYSTERECTOMY    . BACK SURGERY    . bladder lift    . KNEE ARTHROSCOPY    . leiomyosarcoma    . LIPOMA EXCISION     left shoulder  . MICRODISCECTOMY LUMBAR  2008   l5 s1  . ROTATOR CUFF REPAIR Right   . TRIGGER FINGER RELEASE    . TUBAL LIGATION  1988     OB History   No obstetric history on file.      Home Medications    Prior to Admission medications   Medication Sig Start Date End Date Taking? Authorizing Provider  amLODipine (NORVASC) 10 MG tablet Take 10 mg by mouth daily.      [provider]  amoxicillin-clavulanate (AUGMENTIN) 875-125 MG tablet Take 1 tablet by mouth every 12 (twelve) hours for 10 days. 02/03/18 02/13/18  Jeannie FendMurphy, Raye Wiens A, PA-C  aspirin 81 MG chewable tablet Chew 81 mg by mouth daily.    [provider]  atorvastatin (LIPITOR) 40 MG tablet Take 40 mg by mouth daily.    [provider]    cephALEXin (KEFLEX) 500 MG capsule Take 1 capsule (500 mg total) by mouth 3 (three) times daily. 02/21/16   Kirichenko, Tatyana, PA-C  diltiazem (CARDIZEM CD) 300 MG 24 hr capsule Take 300 mg by mouth daily.      [provider]  empagliflozin (JARDIANCE) 25 MG TABS tablet Take 25 mg by mouth daily.    [provider]  estradiol (VIVELLE-DOT) 0.05 MG/24HR Place 1 patch onto the skin once a week.      [provider]  fenofibrate (TRICOR) 145 MG tablet Take 145 mg by mouth daily.      [provider]  fish oil-omega-3 fatty acids 1000 MG capsule Take 2 g by mouth daily.      [provider]  gabapentin (NEURONTIN) 300 MG capsule Take 300 mg by mouth 3 (three) times daily.    [provider]  metFORMIN (GLUCOPHAGE) 1000 MG tablet Take 1,000 mg by mouth 2 (two) times daily with a meal.    [provider]  montelukast (SINGULAIR) 10 MG tablet Take 10 mg by mouth at bedtime.    [provider]  Multiple Vitamin (MULTIVITAMIN) tablet Take 1 tablet by mouth daily.      [provider]  nystatin cream (MYCOSTATIN) Apply 1 application topically 2 (two) times daily.    [provider]  pioglitazone (ACTOS) 30 MG tablet Take 30 mg by mouth daily.    [provider]  sitaGLIPtin-metformin (JANUMET) 50-1000 MG tablet Take 1 tablet by mouth 2 (two) times daily with a meal.    [provider]  telmisartan (MICARDIS) 80 MG tablet Take 80 mg by mouth daily.      [provider]    Family History History reviewed. No pertinent family history.  Social History Social History   Tobacco Use  . Smoking status: Never Smoker  . Smokeless tobacco: Never Used  Substance Use Topics  . Alcohol use: No  . Drug use: No     Allergies   Hydrocodone; Sulfa antibiotics; Sulfasalazine; Cephalexin; Codeine; Nitrofurantoin; Pioglitazone; Hydrocodone-acetaminophen; Zolpidem; Ambien  [zolpidem tartrate]; and  Hydrocodone bitartrate   Review of Systems Review of Systems  Constitutional: Negative for chills and fever.  HENT: Positive for congestion, ear pain, sinus pressure and sinus pain. Negative for sore throat.   Respiratory: Positive for cough. Negative for shortness of breath and wheezing.   Gastrointestinal: Negative for vomiting.  Skin: Negative for wound.  Allergic/Immunologic: Positive for immunocompromised state.  Neurological: Negative for headaches.  Hematological: Negative for adenopathy.  Psychiatric/Behavioral: Negative for confusion.  All other systems reviewed and are negative.    Physical Exam Updated Vital Signs BP (!) 151/90   Pulse 90   Temp 98.4 F (36.9 C) (Oral)   Resp 18   Ht 5\' 2"  (1.575 m)   Wt 78 kg   SpO2 100%   BMI 31.46 kg/m   Physical Exam Vitals signs and nursing note reviewed.  Constitutional:      Appearance: Normal appearance.  HENT:     Head: Normocephalic and atraumatic.     Right Ear: Ear canal normal. A middle ear effusion is present. Tympanic membrane is erythematous and bulging.     Left Ear: Tympanic membrane and ear canal normal.  No middle ear effusion.     Nose: Congestion present.     Right Sinus: Frontal sinus tenderness present. No maxillary sinus tenderness.     Left Sinus: No maxillary sinus tenderness or frontal sinus tenderness.     Mouth/Throat:     Pharynx: No oropharyngeal exudate or posterior oropharyngeal erythema.  Eyes:     General:        Right eye: No discharge.        Left eye: No discharge.  Neck:     Musculoskeletal: Neck supple.  Cardiovascular:     Rate and Rhythm: Normal rate and regular rhythm.     Pulses: Normal pulses.  Pulmonary:     Effort: Pulmonary effort is normal.     Breath sounds: Normal breath sounds.  Lymphadenopathy:     Cervical: No cervical adenopathy.  Skin:    General: Skin is warm and dry.     Findings: No erythema or rash.  Neurological:     Mental Status: She is alert and  oriented to person, place, and time.  Psychiatric:        Behavior: Behavior normal.      ED Treatments / Results  Labs (all labs ordered are listed, but only abnormal results are displayed) Labs Reviewed - No data to display  EKG None  Radiology Dg Chest 2 View  Result Date: 02/03/2018 CLINICAL DATA:  Cough for 5 weeks. EXAM: CHEST - 2 VIEW COMPARISON:  PA and  lateral chest 12/30/2015. FINDINGS: The lungs are clear. Heart size is normal. There is no pneumothorax or pleural fluid. No acute or focal bony abnormality. IMPRESSION: Negative chest. Electronically Signed   By: Drusilla Kanner M.D.   On: 02/03/2018 11:07    Procedures Procedures (including critical care time)  Medications Ordered in ED Medications - No data to display   Initial Impression / Assessment and Plan / ED Course  I have reviewed the triage vital signs and the nursing notes.  Pertinent labs & imaging results that were available during my care of the patient were reviewed by me and considered in my medical decision making (see chart for details).  Clinical Course as of Feb 04 1231  Tue Feb 03, 2018  [id number] 72 year old female with complaint of sinus congestion and cough x1 month, progressively worsening, not improving after 5 days of Zithromax.  Patient with complaint of right ear pain.  On exam patient has acute otitis media the right ear, right frontal sinus tenderness, suspect she has a bacterial sinus infection and ear infection.  Patient treated with Augmentin, reviewing her allergy list there is no allergy to penicillin patient states she tolerates this well.  Commend saline sinus rinse, Flonase, Zyrtec and she may continue with the Mucinex.  Patient should recheck with her PCP if not improving, return to ER for new or worsening symptoms.   [LM]    Clinical Course User Index [LM] Jeannie Fend, PA-C   Final Clinical Impressions(s) / ED Diagnoses   Final diagnoses:  Acute bacterial sinusitis  Acute  otitis media, right    ED Discharge Orders         Ordered    amoxicillin-clavulanate (AUGMENTIN) 875-125 MG tablet  Every 12 hours     02/03/18 1225           Alden Hipp 02/03/18 1232    Linwood Dibbles, MD 02/07/18 1625

## 2018-02-03 NOTE — ED Triage Notes (Signed)
Reports cough with chest congestion for 1 week.  Reports productive cough with green mucus and bilateral ear pressure.

## 2018-03-06 ENCOUNTER — Emergency Department (HOSPITAL_BASED_OUTPATIENT_CLINIC_OR_DEPARTMENT_OTHER)
Admission: EM | Admit: 2018-03-06 | Discharge: 2018-03-06 | Disposition: A | Discharge status: Home / Self Care | Payer: Medicare Other | Attending: Emergency Medicine | Admitting: Emergency Medicine

## 2018-03-06 ENCOUNTER — Encounter (HOSPITAL_BASED_OUTPATIENT_CLINIC_OR_DEPARTMENT_OTHER): Payer: Self-pay | Admitting: *Deleted

## 2018-03-06 ENCOUNTER — Other Ambulatory Visit: Payer: Self-pay

## 2018-03-06 DIAGNOSIS — L0390 Cellulitis, unspecified: Secondary | ICD-10-CM

## 2018-03-06 DIAGNOSIS — N7689 Other specified inflammation of vagina and vulva: Secondary | ICD-10-CM | POA: Insufficient documentation

## 2018-03-06 DIAGNOSIS — E119 Type 2 diabetes mellitus without complications: Secondary | ICD-10-CM | POA: Insufficient documentation

## 2018-03-06 DIAGNOSIS — Z7982 Long term (current) use of aspirin: Secondary | ICD-10-CM | POA: Diagnosis not present

## 2018-03-06 DIAGNOSIS — R102 Pelvic and perineal pain: Secondary | ICD-10-CM | POA: Diagnosis present

## 2018-03-06 DIAGNOSIS — Y9289 Other specified places as the place of occurrence of the external cause: Secondary | ICD-10-CM | POA: Insufficient documentation

## 2018-03-06 DIAGNOSIS — I10 Essential (primary) hypertension: Secondary | ICD-10-CM | POA: Insufficient documentation

## 2018-03-06 MED ORDER — CLINDAMYCIN HCL 300 MG PO CAPS: 300.0000 mg | ORAL_CAPSULE | Freq: Three times a day (TID) | ORAL | 0 refills | Status: AC

## 2018-03-06 NOTE — Discharge Instructions (Addendum)
You were given a prescription for antibiotics. Please take the antibiotic prescription fully.   Please follow up with your primary care provider within 3-5 days for re-evaluation of your symptoms.   Please return to the emergency room immediately if you experience any new or worsening symptoms or any symptoms that indicate worsening infection such as fevers, increased redness/swelling/pain, warmth, or drainage from the affected area.    

## 2018-03-06 NOTE — ED Triage Notes (Signed)
Pt c/o ? Abscess to left groin area

## 2018-03-06 NOTE — ED Provider Notes (Signed)
[facility] EMERGENCY DEPARTMENT Provider Note   CSN: [id number] Arrival date & time: 03/06/18  1423     History   Chief Complaint Chief Complaint  Patient presents with  . Abscess    HPI Sara Roach is a 73 y.o. female.  HPI   Pt is a 73 y/o female with a h/o arthritis, diabetes, hypertension, nephrolithiasis, osteopenia who presents to the ED today for evaluation of a possible left labial abscess that she noted 4 days ago. States that she noted some swelling to the area and that the skin felt hard. States that the area became painful and red. Denies drainage from the area. Denies fevers. Pain rated as mild when not palpating or moving and when palpated she rates pain 9/10. Pain is constant. Seh tried using nystatin cream in the area and symptoms did not resolve. She has never had symptoms like this before.   Past Medical History:  Diagnosis Date  . Arthritis   . Diabetes mellitus   . Hypertension   . Kidney stone   . Osteopenia   . Reflux     There are no active problems to display for this patient.   Past Surgical History:  Procedure Laterality Date  . ABDOMINAL HYSTERECTOMY    . BACK SURGERY    . bladder lift    . KNEE ARTHROSCOPY    . leiomyosarcoma    . LIPOMA EXCISION     left shoulder  . MICRODISCECTOMY LUMBAR  2008   l5 s1  . ROTATOR CUFF REPAIR Right   . TRIGGER FINGER RELEASE    . TUBAL LIGATION  1988     OB History   No obstetric history on file.      Home Medications    Prior to Admission medications   Medication Sig Start Date End Date Taking? Authorizing Provider  amLODipine (NORVASC) 10 MG tablet Take 10 mg by mouth daily.      [provider]  aspirin 81 MG chewable tablet Chew 81 mg by mouth daily.    [provider]  atorvastatin (LIPITOR) 40 MG tablet Take 40 mg by mouth daily.    [provider]  cephALEXin (KEFLEX) 500 MG capsule Take 1 capsule (500 mg total) by mouth 3 (three) times daily.  02/21/16   Kirichenko, Tatyana, PA-C  clindamycin (CLEOCIN) 300 MG capsule Take 1 capsule (300 mg total) by mouth 3 (three) times daily for 7 days. 03/06/18 03/13/18  Evie Crumpler S, PA-C  diltiazem (CARDIZEM CD) 300 MG 24 hr capsule Take 300 mg by mouth daily.      [provider]  empagliflozin (JARDIANCE) 25 MG TABS tablet Take 25 mg by mouth daily.    [provider]  estradiol (VIVELLE-DOT) 0.05 MG/24HR Place 1 patch onto the skin once a week.      [provider]  fenofibrate (TRICOR) 145 MG tablet Take 145 mg by mouth daily.      [provider]  fish oil-omega-3 fatty acids 1000 MG capsule Take 2 g by mouth daily.      [provider]  gabapentin (NEURONTIN) 300 MG capsule Take 300 mg by mouth 3 (three) times daily.    [provider]  metFORMIN (GLUCOPHAGE) 1000 MG tablet Take 1,000 mg by mouth 2 (two) times daily with a meal.    [provider]  montelukast (SINGULAIR) 10 MG tablet Take 10 mg by mouth at bedtime.    [provider]  Multiple  Vitamin (MULTIVITAMIN) tablet Take 1 tablet by mouth daily.      [provider]  nystatin cream (MYCOSTATIN) Apply 1 application topically 2 (two) times daily.    [provider]  pioglitazone (ACTOS) 30 MG tablet Take 30 mg by mouth daily.    [provider]  sitaGLIPtin-metformin (JANUMET) 50-1000 MG tablet Take 1 tablet by mouth 2 (two) times daily with a meal.    [provider]  telmisartan (MICARDIS) 80 MG tablet Take 80 mg by mouth daily.      [provider]    Family History History reviewed. No pertinent family history.  Social History Social History   Tobacco Use  . Smoking status: Never Smoker  . Smokeless tobacco: Never Used  Substance Use Topics  . Alcohol use: No  . Drug use: No     Allergies   Hydrocodone; Sulfa antibiotics; Sulfasalazine; Cephalexin; Codeine; Nitrofurantoin; Pioglitazone;  Hydrocodone-acetaminophen; Zolpidem; Ambien  [zolpidem tartrate]; and Hydrocodone bitartrate er   Review of Systems Review of Systems  Constitutional: Negative for fever.  Eyes: Negative for photophobia.  Respiratory: Negative for shortness of breath.   Cardiovascular: Negative for chest pain.  Gastrointestinal: Negative for abdominal pain.  Genitourinary: Negative for vaginal bleeding and vaginal discharge.       Left labial pain  Musculoskeletal: Negative for back pain.  Skin: Positive for color change.  Neurological: Negative for headaches.    Physical Exam Updated Vital Signs BP (!) 164/68   Pulse 88   Temp 97.8 F (36.6 C)   Resp 18   Ht 5\' 2"  (1.575 m)   Wt 77.1 kg   SpO2 98%   BMI 31.09 kg/m   Physical Exam Vitals signs and nursing note reviewed.  Constitutional:      General: She is not in acute distress.    Appearance: She is well-developed.  HENT:     Head: Normocephalic and atraumatic.  Eyes:     Conjunctiva/sclera: Conjunctivae normal.  Neck:     Musculoskeletal: Neck supple.  Cardiovascular:     Rate and Rhythm: Normal rate.  Pulmonary:     Effort: Pulmonary effort is normal.  Genitourinary:    Comments: Wound present.  External exam of the genitalia completed.  Patient has 0.5 cm x 0.5 cm area of erythema and induration to the left lower labia.  There is no obvious fluctuance on exam.  The area is mildly tender.  There is no drainage noted. Musculoskeletal: Normal range of motion.  Skin:    General: Skin is warm and dry.  Neurological:     Mental Status: She is alert.  Psychiatric:        Mood and Affect: Mood normal.      ED Treatments / Results  Labs (all labs ordered are listed, but only abnormal results are displayed) Labs Reviewed - No data to display  EKG None  Radiology No results found.  Procedures Procedures (including critical care time)  Medications Ordered in ED Medications - No data to display   Initial Impression  / Assessment and Plan / ED Course  I have reviewed the triage vital signs and the nursing notes.  Pertinent labs & imaging results that were available during my care of the patient were reviewed by me and considered in my medical decision making (see chart for details).     Final Clinical Impressions(s) / ED Diagnoses   Final diagnoses:  Cellulitis, unspecified cellulitis site   Patient presenting due to concern for  possible abscess to the left labial area that she noted about 4 days ago.  On exam has a 0.5 x 0.5 cm area of induration and erythema to the left lower labia.  There is no obvious fluctuance on exam no evidence of a drainable abscess present at this time.  She does have mild signs of developing cellulitis.  Because of her history of diabetes, will start her on an antibiotic to prevent progression of this. advised warm soaks at home and close follow-up.  She is planning to schedule an appointment with her PCP later this week.  I advised to return the ER for new or worsening symptoms and discussed that it is possible that her skin infection may lead to drainable abscess in the next several days.  She understands to return to the ER if her symptoms are suggestive of this.  She voices understanding of this plan and reasons to return.  All questions answered.  ED Discharge Orders         Ordered    clindamycin (CLEOCIN) 300 MG capsule  3 times daily     03/06/18 1501           Selin Eisler S, PA-C 03/06/18 1501    Shaune Pollack, MD 03/09/18 1007

## 2022-10-16 ENCOUNTER — Other Ambulatory Visit: Payer: Medicare PPO

## 2022-10-16 ENCOUNTER — Encounter: Payer: Self-pay | Admitting: Internal Medicine

## 2022-10-16 ENCOUNTER — Other Ambulatory Visit: Payer: Self-pay | Admitting: Internal Medicine

## 2022-10-16 DIAGNOSIS — R911 Solitary pulmonary nodule: Secondary | ICD-10-CM
